# Patient Record
Sex: Female | Born: 1945 | Race: Black or African American | Hispanic: No | Marital: Married | Smoking: Never smoker
Health system: Southern US, Community
[De-identification: ages and names within clinical notes are randomized; demographics above are authoritative.]

## PROBLEM LIST (undated history)

## (undated) DIAGNOSIS — I10 Essential (primary) hypertension: Secondary | ICD-10-CM

## (undated) DIAGNOSIS — R0602 Shortness of breath: Secondary | ICD-10-CM

## (undated) DIAGNOSIS — C801 Malignant (primary) neoplasm, unspecified: Secondary | ICD-10-CM

---

## 1978-10-19 HISTORY — PX: ABDOMINAL HYSTERECTOMY: SHX81

## 1998-02-27 ENCOUNTER — Ambulatory Visit (HOSPITAL_COMMUNITY): Admission: RE | Admit: 1998-02-27 | Discharge: 1998-02-27 | Payer: Self-pay | Admitting: Family Medicine

## 1998-07-22 ENCOUNTER — Ambulatory Visit: Admission: RE | Admit: 1998-07-22 | Discharge: 1998-07-22 | Payer: Self-pay | Admitting: Family Medicine

## 1998-08-28 ENCOUNTER — Other Ambulatory Visit: Admission: RE | Admit: 1998-08-28 | Discharge: 1998-08-28 | Payer: Self-pay | Admitting: *Deleted

## 1999-01-10 ENCOUNTER — Ambulatory Visit: Admission: RE | Admit: 1999-01-10 | Discharge: 1999-01-10 | Payer: Self-pay | Admitting: Family Medicine

## 1999-07-29 ENCOUNTER — Ambulatory Visit (HOSPITAL_COMMUNITY): Admission: RE | Admit: 1999-07-29 | Discharge: 1999-07-29 | Payer: Self-pay | Admitting: Internal Medicine

## 1999-07-29 ENCOUNTER — Encounter: Payer: Self-pay | Admitting: Internal Medicine

## 1999-09-26 ENCOUNTER — Encounter: Payer: Self-pay | Admitting: *Deleted

## 1999-09-26 ENCOUNTER — Encounter: Admission: RE | Admit: 1999-09-26 | Discharge: 1999-09-26 | Payer: Self-pay | Admitting: Gynecology

## 1999-10-02 ENCOUNTER — Other Ambulatory Visit: Admission: RE | Admit: 1999-10-02 | Discharge: 1999-10-02 | Payer: Self-pay | Admitting: *Deleted

## 2000-12-13 ENCOUNTER — Other Ambulatory Visit: Admission: RE | Admit: 2000-12-13 | Discharge: 2000-12-13 | Payer: Self-pay | Admitting: *Deleted

## 2000-12-15 ENCOUNTER — Encounter: Payer: Self-pay | Admitting: *Deleted

## 2000-12-15 ENCOUNTER — Encounter: Admission: RE | Admit: 2000-12-15 | Discharge: 2000-12-15 | Payer: Self-pay | Admitting: *Deleted

## 2002-03-02 ENCOUNTER — Encounter: Admission: RE | Admit: 2002-03-02 | Discharge: 2002-03-02 | Payer: Self-pay | Admitting: *Deleted

## 2002-03-02 ENCOUNTER — Encounter: Payer: Self-pay | Admitting: *Deleted

## 2002-05-16 ENCOUNTER — Encounter: Admission: RE | Admit: 2002-05-16 | Discharge: 2002-05-29 | Payer: Self-pay | Admitting: Family Medicine

## 2003-06-28 ENCOUNTER — Encounter: Admission: RE | Admit: 2003-06-28 | Discharge: 2003-06-28 | Payer: Self-pay | Admitting: *Deleted

## 2003-06-28 ENCOUNTER — Encounter: Payer: Self-pay | Admitting: *Deleted

## 2004-02-12 ENCOUNTER — Emergency Department (HOSPITAL_COMMUNITY): Admission: EM | Admit: 2004-02-12 | Discharge: 2004-02-12 | Payer: Self-pay | Admitting: Emergency Medicine

## 2004-02-18 ENCOUNTER — Encounter: Admission: RE | Admit: 2004-02-18 | Discharge: 2004-02-18 | Payer: Self-pay | Admitting: Family Medicine

## 2004-02-27 ENCOUNTER — Encounter: Admission: RE | Admit: 2004-02-27 | Discharge: 2004-02-27 | Payer: Self-pay | Admitting: Family Medicine

## 2004-10-09 ENCOUNTER — Encounter: Admission: RE | Admit: 2004-10-09 | Discharge: 2004-10-09 | Payer: Self-pay | Admitting: Family Medicine

## 2004-10-16 ENCOUNTER — Ambulatory Visit: Payer: Self-pay | Admitting: Family Medicine

## 2004-11-11 ENCOUNTER — Ambulatory Visit: Payer: Self-pay | Admitting: Family Medicine

## 2005-10-08 ENCOUNTER — Ambulatory Visit: Payer: Self-pay | Admitting: Family Medicine

## 2005-10-16 ENCOUNTER — Encounter: Admission: RE | Admit: 2005-10-16 | Discharge: 2005-10-16 | Payer: Self-pay | Admitting: Family Medicine

## 2005-11-10 ENCOUNTER — Ambulatory Visit: Payer: Self-pay | Admitting: Family Medicine

## 2005-11-17 ENCOUNTER — Ambulatory Visit: Payer: Self-pay | Admitting: Family Medicine

## 2005-11-23 ENCOUNTER — Encounter: Admission: RE | Admit: 2005-11-23 | Discharge: 2005-11-23 | Payer: Self-pay | Admitting: Family Medicine

## 2006-04-29 IMAGING — CR DG CHEST 2V
2 series · 2 of 2 positions shown · non-contrast
Comparison: 02/18/04 and also prior chest CT scan dated 02/27/04.

CLINICAL DATA: Cough x two weeks.  Question pneumonia. 
 TWO VIEW CHEST:

[view not recorded (1 of 2)]
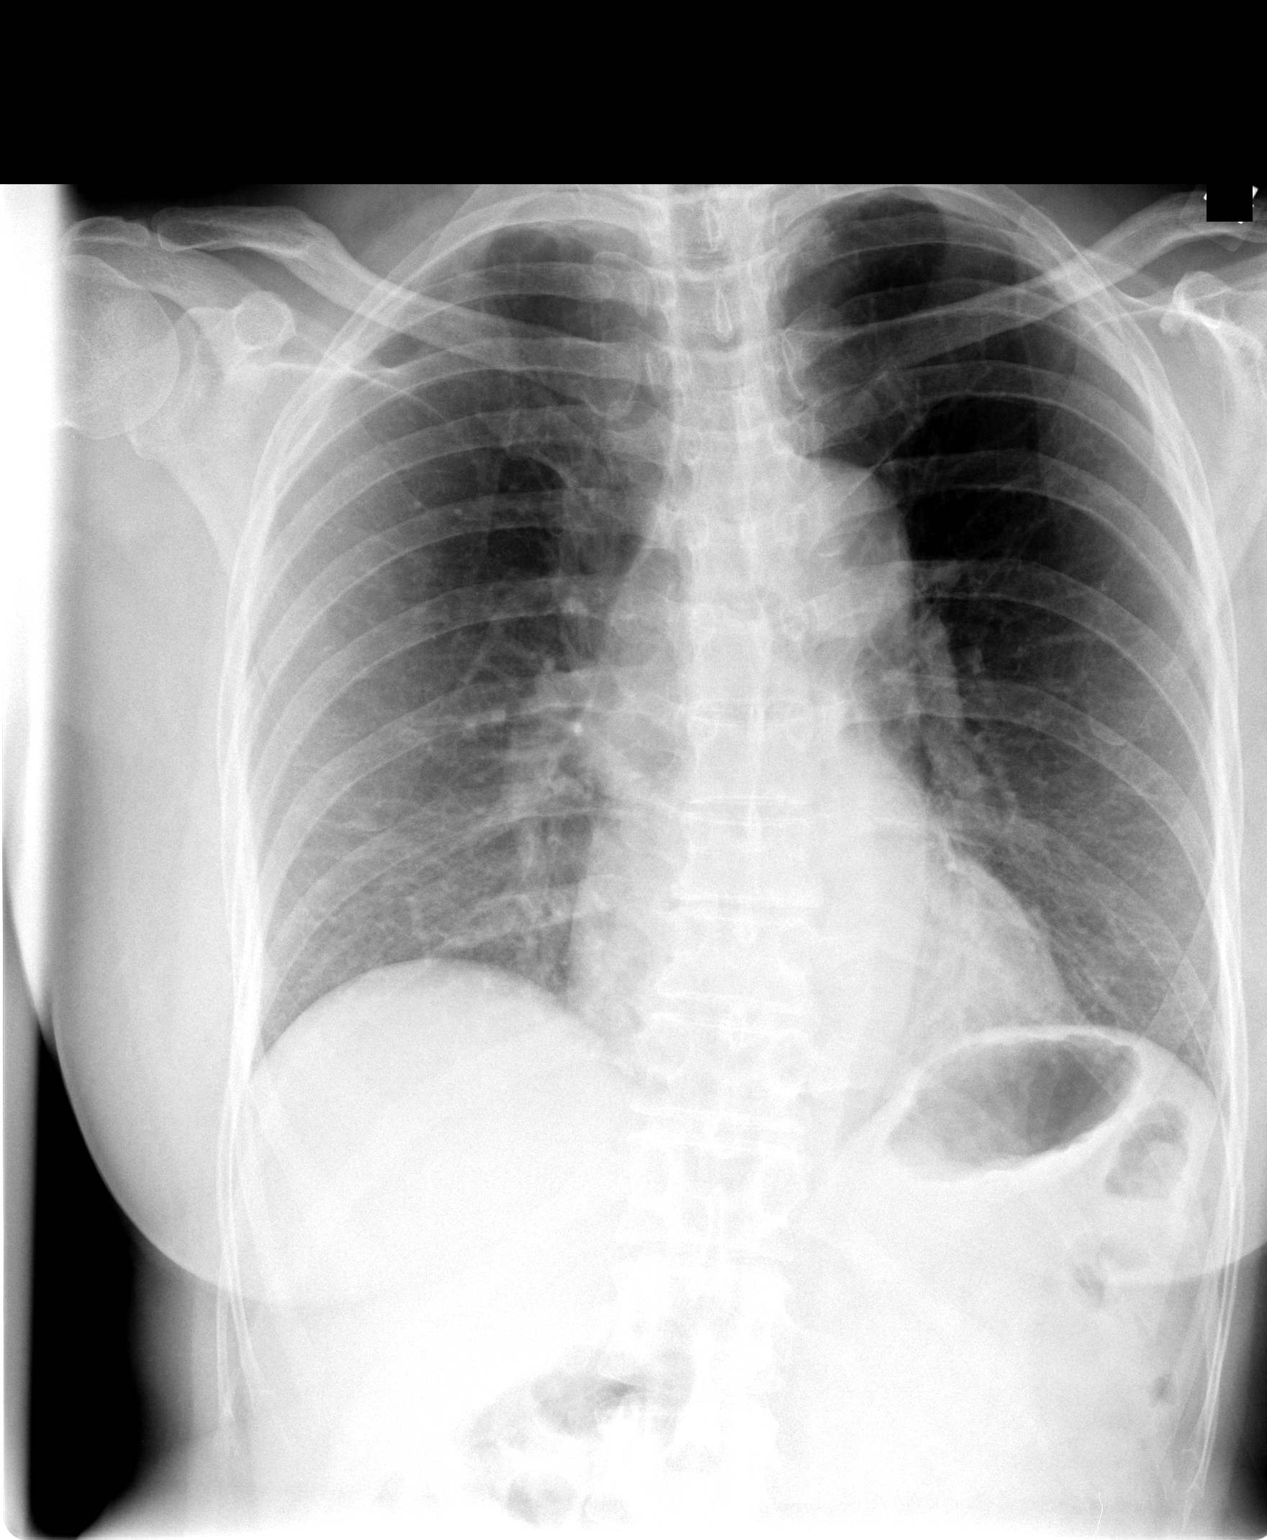

[view not recorded (2 of 2)]
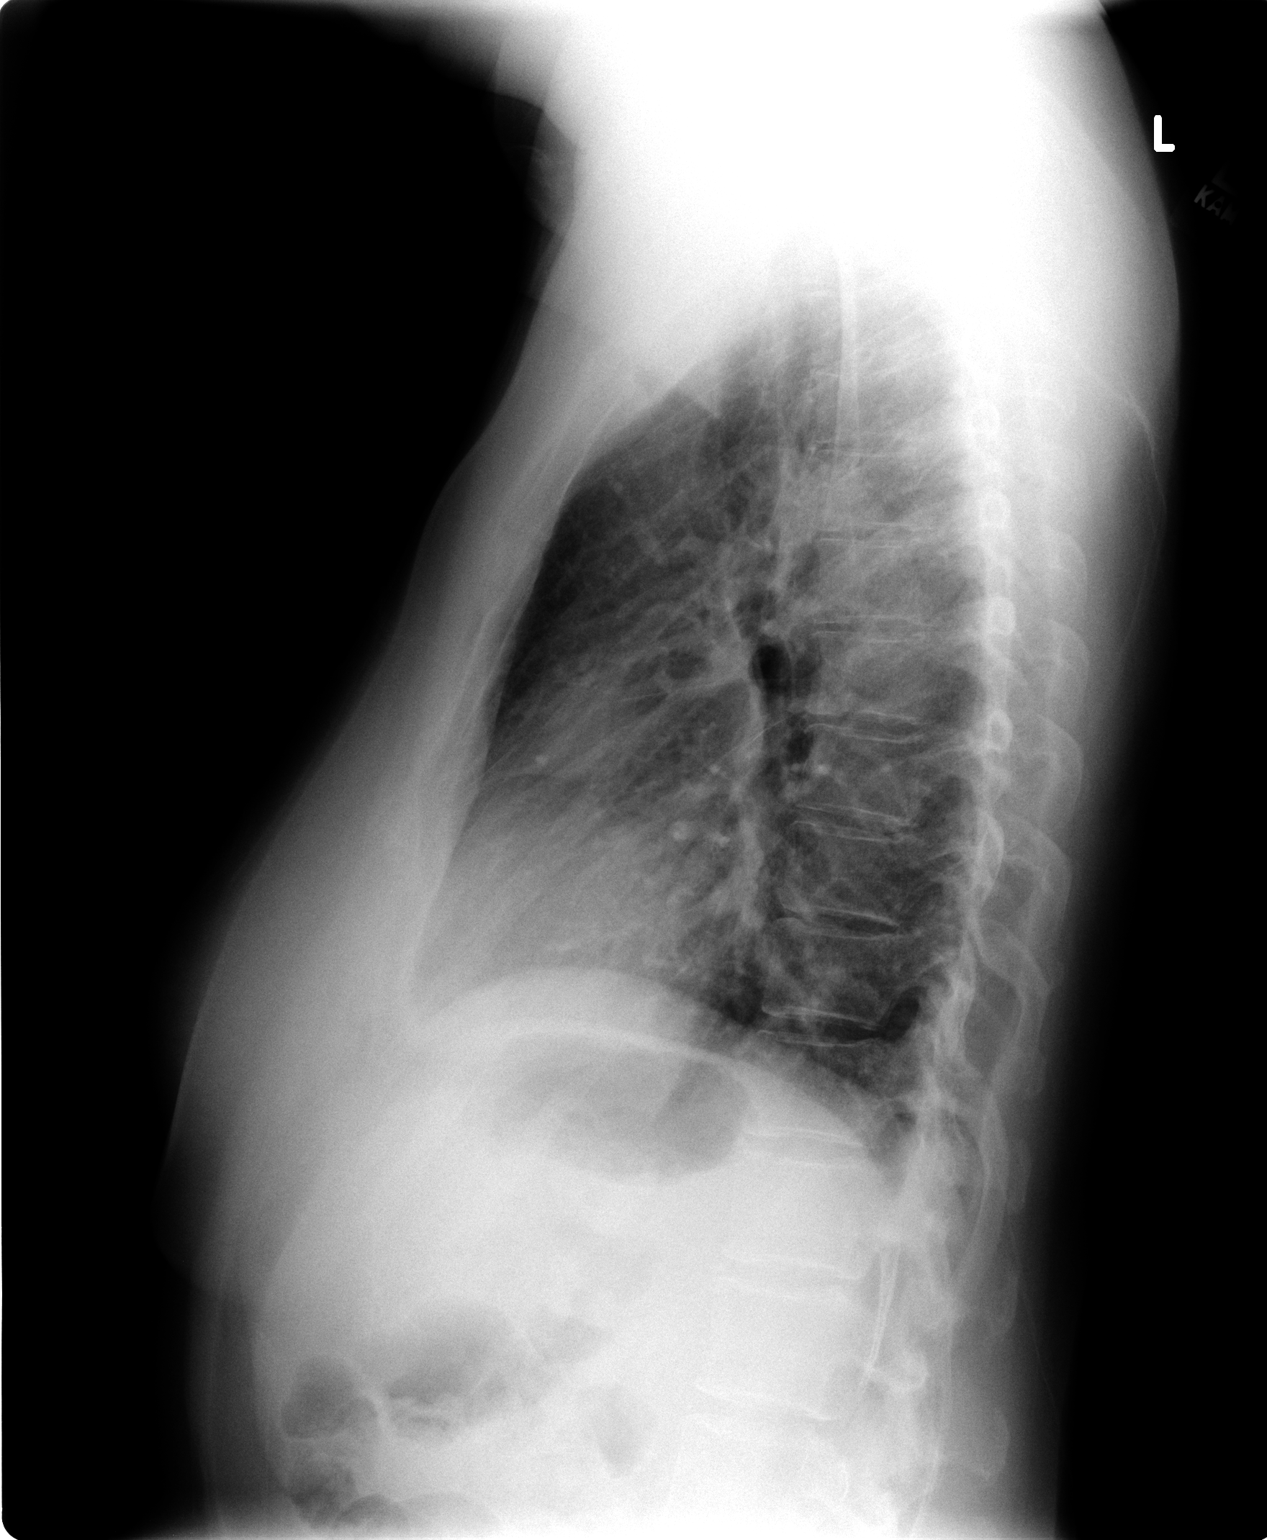

[2 of 2 positions shown; findings below may reference images not displayed]

The right hilum is mildly prominent but is unchanged and is consistent with prominent right pulmonary artery.  There are no infiltrates.  There are mildly accentuated bronchial markings.  The heart is normal in size.
IMPRESSION: Mildly prominent right pulmonary artery ? stable.  No acute infiltrates.

## 2006-11-22 ENCOUNTER — Ambulatory Visit: Payer: Self-pay | Admitting: Family Medicine

## 2006-11-22 LAB — CONVERTED CEMR LAB
ALT: 14 units/L (ref 0–40)
AST: 23 units/L (ref 0–37)
Albumin: 3.8 g/dL (ref 3.5–5.2)
BUN: 13 mg/dL (ref 6–23)
Basophils Relative: 0.5 % (ref 0.0–1.0)
Bilirubin, Direct: 0.1 mg/dL (ref 0.0–0.3)
CO2: 34 meq/L — ABNORMAL HIGH (ref 19–32)
Calcium: 9.5 mg/dL (ref 8.4–10.5)
Cholesterol: 218 mg/dL (ref 0–200)
Creatinine, Ser: 1.1 mg/dL (ref 0.4–1.2)
Direct LDL: 137.2 mg/dL
GFR calc non Af Amer: 54 mL/min
HDL: 56.2 mg/dL (ref >39.0)
Hemoglobin: 12.8 g/dL (ref 12.0–15.0)
Monocytes Absolute: 0.3 10*3/uL (ref 0.2–0.7)
Monocytes Relative: 6.5 % (ref 3.0–11.0)
Platelets: 254 10*3/uL (ref 150–400)
RDW: 14.7 % — ABNORMAL HIGH (ref 11.5–14.6)
Total Bilirubin: 0.6 mg/dL (ref 0.3–1.2)
Total CHOL/HDL Ratio: 3.9
VLDL: 19 mg/dL (ref 0–40)

## 2006-11-29 ENCOUNTER — Ambulatory Visit: Payer: Self-pay | Admitting: Family Medicine

## 2006-12-09 ENCOUNTER — Encounter: Admission: RE | Admit: 2006-12-09 | Discharge: 2006-12-09 | Payer: Self-pay | Admitting: Family Medicine

## 2007-05-15 DIAGNOSIS — M549 Dorsalgia, unspecified: Secondary | ICD-10-CM | POA: Insufficient documentation

## 2007-05-18 ENCOUNTER — Ambulatory Visit: Payer: Self-pay | Admitting: Family Medicine

## 2007-05-18 DIAGNOSIS — I10 Essential (primary) hypertension: Secondary | ICD-10-CM | POA: Insufficient documentation

## 2007-05-18 DIAGNOSIS — G43009 Migraine without aura, not intractable, without status migrainosus: Secondary | ICD-10-CM | POA: Insufficient documentation

## 2007-05-20 ENCOUNTER — Ambulatory Visit: Payer: Self-pay | Admitting: Family Medicine

## 2007-05-23 ENCOUNTER — Ambulatory Visit: Payer: Self-pay | Admitting: Family Medicine

## 2007-10-29 ENCOUNTER — Ambulatory Visit: Payer: Self-pay | Admitting: Family Medicine

## 2007-10-29 DIAGNOSIS — J019 Acute sinusitis, unspecified: Secondary | ICD-10-CM

## 2007-12-12 ENCOUNTER — Ambulatory Visit: Payer: Self-pay | Admitting: Family Medicine

## 2007-12-12 LAB — CONVERTED CEMR LAB
Alkaline Phosphatase: 52 units/L (ref 39–117)
BUN: 17 mg/dL (ref 6–23)
Basophils Relative: 0.8 % (ref 0.0–1.0)
CO2: 33 meq/L — ABNORMAL HIGH (ref 19–32)
Calcium: 9.6 mg/dL (ref 8.4–10.5)
Chloride: 103 meq/L (ref 96–112)
Eosinophils Relative: 0.9 % (ref 0.0–5.0)
GFR calc Af Amer: 73 mL/min
GFR calc non Af Amer: 60 mL/min
HCT: 36.3 % (ref 36.0–46.0)
Hemoglobin: 11.6 g/dL — ABNORMAL LOW (ref 12.0–15.0)
Lymphocytes Relative: 33.4 % (ref 12.0–46.0)
MCV: 86.5 fL (ref 78.0–100.0)
Neutrophils Relative %: 59.2 % (ref 43.0–77.0)
Nitrite: NEGATIVE
Platelets: 249 10*3/uL (ref 150–400)
Potassium: 4.2 meq/L (ref 3.5–5.1)
Protein, U semiquant: NEGATIVE
TSH: 0.89 microintl units/mL (ref 0.35–5.50)
Total Protein: 7.4 g/dL (ref 6.0–8.3)
Urobilinogen, UA: 0.2
VLDL: 12 mg/dL (ref 0–40)
WBC Urine, dipstick: NEGATIVE
WBC: 5.9 10*3/uL (ref 4.5–10.5)

## 2007-12-14 ENCOUNTER — Encounter: Admission: RE | Admit: 2007-12-14 | Discharge: 2007-12-14 | Payer: Self-pay | Admitting: Family Medicine

## 2007-12-26 ENCOUNTER — Ambulatory Visit: Payer: Self-pay | Admitting: Family Medicine

## 2007-12-26 DIAGNOSIS — L301 Dyshidrosis [pompholyx]: Secondary | ICD-10-CM | POA: Insufficient documentation

## 2007-12-26 DIAGNOSIS — N952 Postmenopausal atrophic vaginitis: Secondary | ICD-10-CM

## 2007-12-26 DIAGNOSIS — M159 Polyosteoarthritis, unspecified: Secondary | ICD-10-CM | POA: Insufficient documentation

## 2008-01-02 ENCOUNTER — Encounter: Payer: Self-pay | Admitting: Family Medicine

## 2008-01-02 ENCOUNTER — Ambulatory Visit: Payer: Self-pay | Admitting: Family Medicine

## 2008-01-17 ENCOUNTER — Telehealth (INDEPENDENT_AMBULATORY_CARE_PROVIDER_SITE_OTHER): Payer: Self-pay | Admitting: *Deleted

## 2008-02-21 ENCOUNTER — Telehealth: Payer: Self-pay | Admitting: Family Medicine

## 2008-11-05 DIAGNOSIS — K602 Anal fissure, unspecified: Secondary | ICD-10-CM | POA: Insufficient documentation

## 2008-11-09 ENCOUNTER — Ambulatory Visit: Payer: Self-pay | Admitting: Family Medicine

## 2009-01-04 ENCOUNTER — Ambulatory Visit: Payer: Self-pay | Admitting: Family Medicine

## 2009-01-04 LAB — CONVERTED CEMR LAB
Alkaline Phosphatase: 52 units/L (ref 39–117)
BUN: 22 mg/dL (ref 6–23)
Bilirubin, Direct: 0 mg/dL (ref 0.0–0.3)
Blood in Urine, dipstick: NEGATIVE
CO2: 32 meq/L (ref 19–32)
Chloride: 106 meq/L (ref 96–112)
Creatinine, Ser: 0.8 mg/dL (ref 0.4–1.2)
Direct LDL: 131.9 mg/dL
Eosinophils Absolute: 0.1 10*3/uL (ref 0.0–0.7)
HDL: 58.7 mg/dL (ref >39.00)
MCHC: 33.5 g/dL (ref 30.0–36.0)
MCV: 87.4 fL (ref 78.0–100.0)
Monocytes Absolute: 0.3 10*3/uL (ref 0.1–1.0)
Neutrophils Relative %: 56.1 % (ref 43.0–77.0)
Platelets: 198 10*3/uL (ref 150.0–400.0)
Total Bilirubin: 0.6 mg/dL (ref 0.3–1.2)
VLDL: 14 mg/dL (ref 0.0–40.0)
WBC: 4.9 10*3/uL (ref 4.5–10.5)
pH: 6

## 2009-01-11 ENCOUNTER — Ambulatory Visit: Payer: Self-pay | Admitting: Family Medicine

## 2009-01-11 DIAGNOSIS — L259 Unspecified contact dermatitis, unspecified cause: Secondary | ICD-10-CM

## 2009-01-14 ENCOUNTER — Encounter: Admission: RE | Admit: 2009-01-14 | Discharge: 2009-01-14 | Payer: Self-pay | Admitting: Family Medicine

## 2009-05-20 ENCOUNTER — Ambulatory Visit: Payer: Self-pay | Admitting: Internal Medicine

## 2009-05-20 DIAGNOSIS — K5900 Constipation, unspecified: Secondary | ICD-10-CM | POA: Insufficient documentation

## 2009-05-20 DIAGNOSIS — K219 Gastro-esophageal reflux disease without esophagitis: Secondary | ICD-10-CM

## 2009-05-20 DIAGNOSIS — R1031 Right lower quadrant pain: Secondary | ICD-10-CM

## 2009-06-11 ENCOUNTER — Ambulatory Visit: Payer: Self-pay | Admitting: Internal Medicine

## 2009-09-14 ENCOUNTER — Emergency Department (HOSPITAL_COMMUNITY): Admission: EM | Admit: 2009-09-14 | Discharge: 2009-09-14 | Payer: Self-pay | Admitting: Emergency Medicine

## 2009-11-19 ENCOUNTER — Ambulatory Visit: Payer: Self-pay | Admitting: Family Medicine

## 2009-11-22 DIAGNOSIS — J309 Allergic rhinitis, unspecified: Secondary | ICD-10-CM | POA: Insufficient documentation

## 2009-12-03 ENCOUNTER — Telehealth: Payer: Self-pay | Admitting: Family Medicine

## 2009-12-07 ENCOUNTER — Ambulatory Visit: Payer: Self-pay | Admitting: Family Medicine

## 2009-12-07 DIAGNOSIS — J069 Acute upper respiratory infection, unspecified: Secondary | ICD-10-CM | POA: Insufficient documentation

## 2009-12-09 ENCOUNTER — Telehealth: Payer: Self-pay | Admitting: Family Medicine

## 2010-01-09 ENCOUNTER — Ambulatory Visit: Payer: Self-pay | Admitting: Family Medicine

## 2010-01-09 LAB — CONVERTED CEMR LAB
AST: 21 units/L (ref 0–37)
Alkaline Phosphatase: 52 units/L (ref 39–117)
BUN: 16 mg/dL (ref 6–23)
Basophils Relative: 0.8 % (ref 0.0–3.0)
Bilirubin, Direct: 0.1 mg/dL (ref 0.0–0.3)
Blood in Urine, dipstick: NEGATIVE
CO2: 33 meq/L — ABNORMAL HIGH (ref 19–32)
Calcium: 9.3 mg/dL (ref 8.4–10.5)
Cholesterol: 205 mg/dL — ABNORMAL HIGH (ref 0–200)
Creatinine, Ser: 1.1 mg/dL (ref 0.4–1.2)
Glucose, Bld: 88 mg/dL (ref 70–99)
Glucose, Urine, Semiquant: NEGATIVE
HCT: 35.4 % — ABNORMAL LOW (ref 36.0–46.0)
HDL: 57.1 mg/dL (ref >39.00)
Hemoglobin: 11.3 g/dL — ABNORMAL LOW (ref 12.0–15.0)
Lymphocytes Relative: 34.5 % (ref 12.0–46.0)
MCHC: 32 g/dL (ref 30.0–36.0)
MCV: 88 fL (ref 78.0–100.0)
RBC: 4.02 M/uL (ref 3.87–5.11)
RDW: 15.6 % — ABNORMAL HIGH (ref 11.5–14.6)
Specific Gravity, Urine: 1.025
TSH: 1.11 microintl units/mL (ref 0.35–5.50)
Total Bilirubin: 0.4 mg/dL (ref 0.3–1.2)
Triglycerides: 84 mg/dL (ref 0.0–149.0)
pH: 5

## 2010-01-16 ENCOUNTER — Ambulatory Visit: Payer: Self-pay | Admitting: Family Medicine

## 2010-03-20 ENCOUNTER — Encounter: Admission: RE | Admit: 2010-03-20 | Discharge: 2010-03-20 | Payer: Self-pay | Admitting: Family Medicine

## 2010-11-09 ENCOUNTER — Encounter: Payer: Self-pay | Admitting: Family Medicine

## 2010-11-18 NOTE — Assessment & Plan Note (Signed)
Summary: cpx/njr   Vital Signs:  Patient profile:   65 year old female Menstrual status:  hysterectomy Height:      68 inches Weight:      196 pounds Temp:     98.4 degrees F oral BP sitting:   140 / 84  (left arm) Cuff size:   regular  Vitals Entered By: Kern Reap CMA Duncan Dull) (January 16, 2010 10:32 AM) CC: cpx Is Patient Diabetic? No Pain Assessment Patient in pain? no        Primary Care Provider:  Kelle Darting, MD  CC:  cpx.  History of Present Illness: Pamela Blake a 65 year old, married female, nonsmoker, who comes in today for evaluation of hypertension, eczema.  Her hypertension is treated with hydrochlorothiazide 25 mg daily.  BP 140/84.  Her eczema was treated with Lidex cream .05% b.i.d. p.r.n.  She takes calcium and vitamin D, and an aspirin tablet daily.  She gets routine eye care.  Dental care.  Colonoscopy every 10 years in GI.  She does BSE monthly and gets annual mammography.  Tetanus 2002.  Declines flu shot  Allergies: No Known Drug Allergies  Past History:  Past medical, surgical, family and social histories (including risk factors) reviewed, and no changes noted (except as noted below).  Past Medical History: Reviewed history from 12/26/2007 and no changes required. 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  Dr. Ezzard Standing   Past Surgical History: Reviewed history from 05/20/2009 and no changes required. cbx2 TAH/BSO Hysterectomy B-TUMOR L NOSRIL hernia repair  Family History: Reviewed history from 05/20/2009 and no changes required. father died in his 54s from a pulmonary embolus mother late 62s.  Osteoarthritis breast cancer, and circulation problems one brother in good health 4 sisters in good health No FH of Colon Cancer:  Social History: Reviewed history from 05/20/2009 and no changes required. 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 No apparent alopecia or balding. Eyes:  No corneal or conjunctival inflammation noted. EOMI. Perrla. Funduscopic exam benign, without hemorrhages, exudates or papilledema. Vision grossly normal. Ears:  External ear exam shows no significant lesions or deformities.  Otoscopic examination reveals clear canals, tympanic membranes are intact bilaterally without bulging, retraction, inflammation or discharge. Hearing is grossly normal bilaterally. Nose:  External nasal examination shows no deformity or inflammation. Nasal mucosa are pink and moist without lesions or exudates. Mouth:  Oral mucosa and oropharynx without lesions or exudates.  Teeth in good repair. Neck:  No deformities, masses, or tenderness noted. Chest Wall:  No deformities, masses, or tenderness noted. Breasts:  No mass, nodules, thickening, tenderness, bulging, retraction, inflamation, nipple discharge or skin changes noted.   Lungs:  Normal respiratory effort, chest expands symmetrically. Lungs are clear to auscultation, no crackles or wheezes. Heart:  Normal rate and regular rhythm. S1 and S2 normal without gallop, murmur, click, rub or other extra sounds. Abdomen:  Bowel sounds positive,abdomen soft and non-tender without masses, organomegaly or hernias noted. Rectal:  No external abnormalities noted. Normal sphincter tone. No rectal masses or tenderness. Genitalia:  Pelvic Exam:        External: normal female genitalia without lesions or masses  Vagina: normal without lesions or masses        Cervix: normal without lesions or masses        Adnexa: normal bimanual exam without masses or fullness        Uterus: normal by  palpation        Pap smear: not performed Msk:  No deformity or scoliosis noted of thoracic or lumbar spine.   Pulses:  R and L carotid,radial,femoral,dorsalis pedis and posterior tibial pulses are full and equal bilaterally Extremities:  No clubbing, cyanosis, edema, or deformity noted with normal full range of motion of all joints.   Neurologic:  No cranial nerve deficits noted. Station and gait are normal. Plantar reflexes are down-going bilaterally. DTRs are symmetrical throughout. Sensory, motor and coordinative functions appear intact.   Impression & Recommendations:  Problem # 1:  CONTACT DERMATITIS&OTHER ECZEMA DUE UNSPEC CAUSE (ICD-692.9) Assessment Improved  Her updated medication list for this problem includes:    Lidex 0.05 % Crea (Fluocinonide) .Marland Kitchen... Apply two times a day  Problem # 2:  PHYSICAL EXAMINATION (ICD-V70.0) Assessment: Unchanged  Orders: Prescription Created Electronically [phone])  Problem # 3:  HYPERTENSION (ICD-401.9) Assessment: Improved  Her updated medication list for this problem includes:    Hydrochlorothiazide 25 Mg Tabs (Hydrochlorothiazide) ..... Q am  Orders: Prescription Created Electronically [phone]) EKG w/ Interpretation (93000)  Complete Medication List: 1)  Hydrochlorothiazide 25 Mg Tabs (Hydrochlorothiazide) .... Q am 2)  Aspirin 325 Mg Tabs (Aspirin) .... One tablet by mouth once daily 3)  Ca+  .... Once daily 4)  Lidex 0.05 % Crea (Fluocinonide) .... Apply two times a day 5)  Vitamin D 1000 Unit Caps (Cholecalciferol) .... Once daily 6)  Calcium 600 1500 Mg Tabs (Calcium carbonate) .... Once daily 7)  Cheratussin Ac 100-10 Mg/65ml Syrp (Guaifenesin-codeine) .Marland Kitchen.. 1-2 tsp by mouth at bedtime as needed cough  Patient Instructions: 1)  Please schedule a follow-up appointment in 1 year. 2)  It is important that you exercise regularly at least 20 minutes 5 times a week. If you develop chest pain, have severe difficulty breathing, or feel  very tired , stop exercising immediately and seek medical attention. 3)  Schedule your mammogram. 4)  Schedule a colonoscopy/sigmoidoscopy to help detect colon cancer. 5)  Take calcium +Vitamin D daily. 6)  Take an Aspirin every day. Prescriptions: LIDEX 0.05 %  CREA (FLUOCINONIDE) apply two times a day  #60 grms x 6   Entered and Authorized by:   Roderick Pee MD   Signed by:   Roderick Pee MD on 01/16/2010   Method used:   Electronically to        [facility] [address] [phone]* (retail)       [address]       [place], [place]  [address]       Ph: [phone] or [phone]       Fax: [phone]   RxID:   [id number] HYDROCHLOROTHIAZIDE 25 MG TABS (HYDROCHLOROTHIAZIDE) q am  #100 Tablet x 4   Entered and Authorized by:   Roderick Pee MD   Signed by:   Roderick Pee MD on 01/16/2010   Method used:   [facility]        [facility] [address] [phone]* (retail)       [address]       [place], [place]  [address]       Ph: [phone] or [phone]       Fax: [phone]   RxID:   [id number]

## 2010-11-18 NOTE — Progress Notes (Signed)
Summary: Call-A-Nurse Report   Call-A-Nurse Triage Call Report Triage Record Num: [id number] Operator: Revonda Humphrey Patient Name: Pamela Blake Call Date & Time: 12/07/2009 10:03:20AM Patient Phone: PCP: Patient Gender: Female PCP Fax : Patient DOB: [birth year] [facility] Name: [facility] Reason for Call: Patient calling about cough, runny nose, headache x2 days started 12/05/09. c/o chest discomfort like pressure when coughing and that pain also in lower back when cough. Afebrile. Spoke with Vanese in office, sending to office now for eval. Protocol(s) Used: Chest Pain / Discomfort Recommended Outcome per Protocol: See Provider within 72 Hours Reason for Outcome: All other situations Care Advice:  ~ 12/07/2009 10:36:54AM Page 1 of 1 [id number]

## 2010-11-18 NOTE — Assessment & Plan Note (Signed)
Summary: ?sinus inf/allergy/cjr   Vital Signs:  Patient profile:   65 year old female Menstrual status:  hysterectomy Weight:      200 pounds Temp:     98.5 degrees F oral BP sitting:   124 / 80  (left arm) Cuff size:   regular  Vitals Entered By: Kern Reap CMA Duncan Dull) (November 19, 2009 10:55 AM)  Reason for Visit sinus pressure  Primary Care Provider:  Kelle Darting, MD   History of Present Illness: Pamela Blake a 65 year old female comes in today for a 3 week history of head congestion, runny nose, postnasal drip.  She said a history of allergic rhinitis.  No asthma, currently.  Does not recall any specific triggers.  Review of systems negative  Allergies: No Known Drug Allergies  Past History:  Past medical, surgical, family and social histories (including risk factors) reviewed for relevance to current acute and chronic problems.  Past Medical History: Reviewed history from 12/26/2007 and no changes required. 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  Dr. Ezzard Standing   Past Surgical History: Reviewed history from 05/20/2009 and no changes required. cbx2 TAH/BSO Hysterectomy B-TUMOR L NOSRIL hernia repair  Family History: Reviewed history from 05/20/2009 and no changes required. father died in his 32s from a pulmonary embolus mother late 35s.  Osteoarthritis breast cancer, and circulation problems one brother in good health 4 sisters in good health No FH of Colon Cancer:  Social History: Reviewed history from 05/20/2009 and no changes required. Occupation: Married Never Smoked Alcohol use-no Drug use-no Regular exercise-yes Daily Caffeine Use: 5 daily   Review of Systems      See HPI  Physical Exam  General:  Well-developed,well-nourished,in no acute distress; alert,appropriate and cooperative throughout examination Head:  Normocephalic and atraumatic without obvious  abnormalities. No apparent alopecia or balding. Eyes:  No corneal or conjunctival inflammation noted. EOMI. Perrla. Funduscopic exam benign, without hemorrhages, exudates or papilledema. Vision grossly normal. Ears:  External ear exam shows no significant lesions or deformities.  Otoscopic examination reveals clear canals, tympanic membranes are intact bilaterally without bulging, retraction, inflammation or discharge. Hearing is grossly normal bilaterally. Nose:  External nasal examination shows no deformity or inflammation. Nasal mucosa are pink and moist without lesions or exudates. Mouth:  Oral mucosa and oropharynx without lesions or exudates.  Teeth in good repair. Neck:  No deformities, masses, or tenderness noted. Chest Wall:  No deformities, masses, or tenderness noted. Lungs:  Normal respiratory effort, chest expands symmetrically. Lungs are clear to auscultation, no crackles or wheezes.   Impression & Recommendations:  Problem # 1:  ALLERGIC RHINITIS (ICD-477.9) Assessment New  Complete Medication List: 1)  Hydrochlorothiazide 25 Mg Tabs (Hydrochlorothiazide) .... Q am 2)  Aspirin 325 Mg Tabs (Aspirin) .... One tablet by mouth once daily 3)  Ca+  .... Once daily 4)  Lidex 0.05 % Crea (Fluocinonide) .... Apply two times a day 5)  Vitamin D 1000 Unit Caps (Cholecalciferol) .... Once daily 6)  Calcium 600 1500 Mg Tabs (Calcium carbonate) .... Once daily  Other Orders: Prescription Created Electronically [phone])  Patient Instructions: 1)  begin plain Claritin, or plain Zyrtec.  Return p.r.n.

## 2010-11-18 NOTE — Progress Notes (Signed)
Summary: REQ FOR REFERRAL?  Phone Note Call from Patient   Caller: Patient [phone] Reason for Call: Talk to Nurse, Talk to Doctor Summary of Call: Pt called to req that Dr Tawanna Cooler give her a referral to an Allergy Specialist.... Pt adv she is still exhibiting sxs of congestion, ST, aches/pains...Marland KitchenMarland Kitchen Pt adv that due to her insurance she will need to have a referral in order for her insurance to pay...Marland KitchenMarland Kitchen Pt can be reached at [phone].  Pt would like her appt with an allergist to be mid-morning if possible...Marland Kitchen??  Initial call taken by: Debbra Riding,  December 03, 2009 9:20 AM  Follow-up for Phone Call        patient can call Dr. Thedore Mins, and make her own appointment.  Does not need a referral from me Follow-up by: Roderick Pee MD,  December 03, 2009 10:09 AM  Additional Follow-up for Phone Call Additional follow up Details #1::        Phone Call Completed---- Pt adv of Dr. Nelida Meuse instructions.... pt was given # to Dr Thedore Mins as well as [facility]  per pt request. Additional Follow-up by: Debbra Riding,  December 03, 2009 10:39 AM

## 2010-11-18 NOTE — Assessment & Plan Note (Signed)
Summary: HEAD COLD AN SLIGHT CHEST DISCOMFORT//VGJ   Vital Signs:  Patient profile:   65 year old female Menstrual status:  hysterectomy Weight:      192 pounds O2 Sat:      97 % on Room air Temp:     98.6 degrees F oral Pulse rate:   93 / minute BP sitting:   110 / 70  (left arm)  Vitals Entered By: Doristine Devoid (December 07, 2009 12:09 PM)  O2 Flow:  Room air CC: sinus congestion and cough lots of pressure and some HA   Acute Visit History:      The patient complains of cough, earache, fever, headache, nasal discharge, and sore throat.  These symptoms began 4 days ago.  She denies sinus problems.  Other comments include: chills body ache, back ache gradually improving. occ dizzy tolerating by mouth   OTC zyrtec, tyleno, mucinex.        Her highest temperature has been 101.        The cough interferes with her sleep.  The character of the cough is described as nonproductive.  There is no history of wheezing or shortness of breath associated with her cough.        The earache is located on both sides.        Problems Prior to Update: 1)  Allergic Rhinitis  (ICD-477.9) 2)  Gerd  (ICD-530.81) 3)  Screening Colorectal-cancer  (ICD-V76.51) 4)  Constipation  (ICD-564.00) 5)  Abdominal Pain-rlq  (ICD-789.03) 6)  Contact Dermatitis&other Eczema Due Unspec Cause  (ICD-692.9) 7)  Rectal Fissure  (ICD-565.0) 8)  Dyshidrosis  (ICD-705.81) 9)  Vaginitis, Atrophic  (ICD-627.3) 10)  Generalized Osteoarthrosis Unspecified Site  (ICD-715.00) 11)  Physical Examination  (ICD-V70.0) 12)  Sinusitis- Acute-nos  (ICD-461.9) 13)  Back Pain, Right  (ICD-724.5) 14)  Common Migraine  (ICD-346.10) 15)  Hypertension  (ICD-401.9)  Current Medications (verified): 1)  Hydrochlorothiazide 25 Mg Tabs (Hydrochlorothiazide) .... Q Am 2)  Aspirin 325 Mg  Tabs (Aspirin) .... One Tablet By Mouth Once Daily 3)  Ca+ .... Once Daily 4)  Lidex 0.05 %  Crea (Fluocinonide) .... Apply Two Times A Day 5)   Vitamin D 1000 Unit Caps (Cholecalciferol) .... Once Daily 6)  Calcium 600 1500 Mg Tabs (Calcium Carbonate) .... Once Daily 7)  Cheratussin Ac 100-10 Mg/10ml Syrp (Guaifenesin-Codeine) .Marland Kitchen.. 1-2 Tsp By Mouth At Bedtime As Needed Cough  Allergies (verified): No Known Drug Allergies  Past History:  Past medical, surgical, family and social histories (including risk factors) reviewed, and no changes noted (except as noted below).  Past Medical History: Reviewed history from 12/26/2007 and no changes required. 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  Dr. Ezzard Standing   Past Surgical History: Reviewed history from 05/20/2009 and no changes required. cbx2 TAH/BSO Hysterectomy B-TUMOR L NOSRIL hernia repair  Family History: Reviewed history from 05/20/2009 and no changes required. father died in his 21s from a pulmonary embolus mother late 67s.  Osteoarthritis breast cancer, and circulation problems one brother in good health 4 sisters in good health No FH of Colon Cancer:  Social History: Reviewed history from 05/20/2009 and no changes required. Occupation: Married Never Smoked Alcohol use-no Drug use-no Regular exercise-yes Daily Caffeine Use: 5 daily   Review of Systems General:  Complains of chills and fatigue. CV:  chest pain with cough. Resp:  Denies shortness of breath. GI:  Denies abdominal pain.  Physical Exam  General:  overweight female in NAD Head:  no maxillary sinus ttp Ears:  clear fluid B TMs Nose:  nasal discharge, no mucosal pallor.   Mouth:  MMM Neck:  no carotid bruit or thyromegaly no cervical or supraclavicular lymphadenopathy  Lungs:  Normal respiratory effort, chest expands symmetrically. Lungs are clear to auscultation, no crackles or wheezes. Heart:  Normal rate and regular rhythm. S1 and S2 normal without gallop, murmur, click, rub or other extra sounds.   Impression &  Recommendations:  Problem # 1:  URI (ICD-465.9) VS flu. Out of timeline for tamiflu. Recommend symptomatica care. Call if not imrpoving as expected.  Her updated medication list for this problem includes:    Aspirin 325 Mg Tabs (Aspirin) ..... One tablet by mouth once daily    Cheratussin Ac 100-10 Mg/4ml Syrp (Guaifenesin-codeine) .Marland Kitchen... 1-2 tsp by mouth at bedtime as needed cough  Complete Medication List: 1)  Hydrochlorothiazide 25 Mg Tabs (Hydrochlorothiazide) .... Q am 2)  Aspirin 325 Mg Tabs (Aspirin) .... One tablet by mouth once daily 3)  Ca+  .... Once daily 4)  Lidex 0.05 % Crea (Fluocinonide) .... Apply two times a day 5)  Vitamin D 1000 Unit Caps (Cholecalciferol) .... Once daily 6)  Calcium 600 1500 Mg Tabs (Calcium carbonate) .... Once daily 7)  Cheratussin Ac 100-10 Mg/65ml Syrp (Guaifenesin-codeine) .Marland Kitchen.. 1-2 tsp by mouth at bedtime as needed cough  Patient Instructions: 1)  Rest, fluids push. 2)  Cough supressant, nasal saline and mucinex. 3)  Call if not improving as expected in 5-7 days.  Prescriptions: CHERATUSSIN AC 100-10 MG/5ML SYRP (GUAIFENESIN-CODEINE) 1-2 tsp by mouth at bedtime as needed cough  #8 oz x 0   Entered and Authorized by:   Kerby Nora MD   Signed by:   Kerby Nora MD on 12/07/2009   Method used:   Print then Give to Patient   RxID:   [id number]     VITAL SIGNS:  Patient Profile:   65 Years Old Female Height:     68 inches Weight:      192 pounds O2 Sat:      97 % BP sitting:   110 / 70 Temp:     98.6 degrees F. Pulse rate:   93

## 2011-01-02 ENCOUNTER — Telehealth: Payer: Self-pay | Admitting: Family Medicine

## 2011-01-02 NOTE — Telephone Encounter (Signed)
Pt called and needs to know the date of her last tetanus shot. Pt was bitten by a child yesterday.

## 2011-01-05 NOTE — Telephone Encounter (Signed)
patient  Notified that it is time for another Tdap

## 2011-01-27 ENCOUNTER — Other Ambulatory Visit: Payer: Self-pay | Admitting: Family Medicine

## 2011-03-03 NOTE — Assessment & Plan Note (Signed)
[facility]                                 ON-CALL NOTE   MARCUS, GROLL                     MRN:          [id number]  DATE:10/29/2007                            DOB:          07-Mar-1946    Patient Pamela Blake, DOB 04/01/1946; date of interaction 10/29/2007  at 8:21 a.m.   PHONE NUMBER:  [phone]   OBJECTIVE:  The patient has an upper respiratory infection with chest  pain, cough and sore throat.  She would like to be seen.  She was told  to come in at 10:45 A.M.   PRIMARY CARE PHYSICIAN:  Dr. Tawanna Cooler, office is [place].     Arta Silence, MD  Electronically Signed    RNS/MedQ  DD: 10/29/2007  DT: 10/29/2007  Job #: [id number]

## 2011-06-04 ENCOUNTER — Other Ambulatory Visit: Payer: Self-pay | Admitting: Family Medicine

## 2011-06-04 DIAGNOSIS — Z1231 Encounter for screening mammogram for malignant neoplasm of breast: Secondary | ICD-10-CM

## 2011-07-08 ENCOUNTER — Ambulatory Visit
Admission: RE | Admit: 2011-07-08 | Discharge: 2011-07-08 | Disposition: A | Discharge status: Home / Self Care | Payer: Federal, State, Local not specified - PPO | Source: Ambulatory Visit | Attending: Family Medicine | Admitting: Family Medicine

## 2011-07-08 DIAGNOSIS — Z1231 Encounter for screening mammogram for malignant neoplasm of breast: Secondary | ICD-10-CM

## 2012-05-24 ENCOUNTER — Other Ambulatory Visit: Payer: Self-pay | Admitting: Family Medicine

## 2012-05-24 DIAGNOSIS — R319 Hematuria, unspecified: Secondary | ICD-10-CM

## 2012-05-26 ENCOUNTER — Ambulatory Visit
Admission: RE | Admit: 2012-05-26 | Discharge: 2012-05-26 | Disposition: A | Discharge status: Home / Self Care | Payer: 59 | Source: Ambulatory Visit | Attending: Family Medicine | Admitting: Family Medicine

## 2012-05-26 DIAGNOSIS — R319 Hematuria, unspecified: Secondary | ICD-10-CM

## 2013-03-29 ENCOUNTER — Other Ambulatory Visit: Payer: Self-pay

## 2013-03-29 DIAGNOSIS — Z1231 Encounter for screening mammogram for malignant neoplasm of breast: Secondary | ICD-10-CM

## 2013-04-03 ENCOUNTER — Ambulatory Visit
Admission: RE | Admit: 2013-04-03 | Discharge: 2013-04-03 | Disposition: A | Discharge status: Home / Self Care | Payer: Medicare Other | Source: Ambulatory Visit

## 2013-04-03 DIAGNOSIS — Z1231 Encounter for screening mammogram for malignant neoplasm of breast: Secondary | ICD-10-CM

## 2013-11-10 ENCOUNTER — Encounter (INDEPENDENT_AMBULATORY_CARE_PROVIDER_SITE_OTHER): Payer: Self-pay

## 2013-11-10 ENCOUNTER — Encounter (INDEPENDENT_AMBULATORY_CARE_PROVIDER_SITE_OTHER): Payer: Self-pay | Admitting: Surgery

## 2013-11-10 ENCOUNTER — Ambulatory Visit (INDEPENDENT_AMBULATORY_CARE_PROVIDER_SITE_OTHER): Payer: Medicare Other | Admitting: Surgery

## 2013-11-10 VITALS — BP 126/80 | HR 76 | Temp 98.0°F | Resp 18 | Ht 69.0 in | Wt 196.0 lb

## 2013-11-10 DIAGNOSIS — K859 Acute pancreatitis without necrosis or infection, unspecified: Secondary | ICD-10-CM

## 2013-11-10 DIAGNOSIS — K8010 Calculus of gallbladder with chronic cholecystitis without obstruction: Secondary | ICD-10-CM | POA: Insufficient documentation

## 2013-11-10 NOTE — Patient Instructions (Signed)
  [facility]  LAPAROSCOPIC SURGERY - POST-OP INSTRUCTIONS  Always review your discharge instruction sheet given to you by the facility where your surgery was performed.  A prescription for pain medication may be given to you upon discharge.  Take your pain medication as prescribed.  If narcotic pain medicine is not needed, then you may take acetaminophen (Tylenol) or ibuprofen (Advil) as needed.  Take your usually prescribed medications unless otherwise directed.  If you need a refill on your pain medication, please contact your pharmacy.  They will contact our office to request authorization. Prescriptions will not be filled after 5 P.M. or on weekends.  You should follow a light diet the first few days after arrival home, such as soup and crackers or toast.  Be sure to include plenty of fluids daily.  Most patients will experience some swelling and bruising in the area of the incisions.  Ice packs will help.  Swelling and bruising can take several days to resolve.   It is common to experience some constipation if taking pain medication after surgery.  Increasing fluid intake and taking a stool softener (such as Colace) will usually help or prevent this problem from occurring.  A mild laxative (Milk of Magnesia or Miralax) should be taken according to package instructions if there are no bowel movements after 48 hours.  Unless discharge instructions indicate otherwise, you may remove your bandages 24-48 hours after surgery, and you may shower at that time.  You may have steri-strips (small skin tapes) in place directly over the incision.  These strips should be left on the skin for 7-10 days.  If your surgeon used skin glue on the incision, you may shower in 24 hours.  The glue will flake off over the next 2-3 weeks.  Any sutures or staples will be removed at the office during your follow-up visit.  ACTIVITIES:  You may resume regular (light) daily activities beginning the  next day-such as daily self-care, walking, climbing stairs-gradually increasing activities as tolerated.  You may have sexual intercourse when it is comfortable.  Refrain from any heavy lifting or straining until approved by your doctor.  You may drive when you are no longer taking prescription pain medication, you can comfortably wear a seatbelt, and you can safely maneuver your car and apply brakes.  You should see your doctor in the office for a follow-up appointment approximately 2-3 weeks after your surgery.  Make sure that you call for this appointment within a day or two after you arrive home to insure a convenient appointment time.  WHEN TO CALL YOUR DOCTOR: 1. Fever over 101.0 2. Inability to urinate 3. Continued bleeding from incision 4. Increased pain, redness, or drainage from the incision 5. Increasing abdominal pain  The clinic staff is available to answer your questions during regular business hours.  Please don't hesitate to call and ask to speak to one of the nurses for clinical concerns.  If you have a medical emergency, go to the nearest emergency room or call 911.  A surgeon from [facility] is always on call for the hospital.  Aerilynn Goin M. Macenzie Burford, MD, FACS [facility] Office: [phone] Toll Free:  [phone] FAX [phone]  Web site: [email or web address] 

## 2013-11-10 NOTE — Progress Notes (Signed)
General Surgery [facility]  Chief Complaint  Patient presents with  . New Evaluation    symptomatic gallstones, pancreatitis - referral from Dr. Dala Dock @ [employer]    HISTORY: Patient is a 68 year old female referred by her primary care physician for known cholelithiasis and recent episode of biliary colic complicated by pancreatitis. Patient experienced onset last week of sudden abdominal pain. This radiated to the back. It was associated with nausea and vomiting. Patient denies fever but did have chills. She denies jaundice or acholic stools. Laboratory studies showed a normal CBC, normal liver function tests, but an elevated lipase level of 111. Patient had had a previous ultrasound in 2013 which documented gallstones. Patient improved symptomatically. No further diagnostic studies were performed. Patient is referred to surgery for consideration for cholecystectomy.  Previous abdominal surgery includes a total abdominal hysterectomy and a previous umbilical hernia repair.  Family history is notable for cholecystectomy and the patient's daughter.   History reviewed. No pertinent past medical history.  Current Outpatient Prescriptions  Medication Sig Dispense Refill  . calcium-vitamin D (OSCAL) 250-125 MG-UNIT per tablet Take 1 tablet by mouth daily.      . hydrochlorothiazide 25 MG tablet TAKE 1 TABLET EVERY MORNING  100 tablet  0   No current facility-administered medications for this visit.    No Known Allergies  History reviewed. No pertinent family history.  History   Social History  . Marital Status: Married    Spouse Name: N/A    Number of Children: N/A  . Years of Education: N/A   Social History Main Topics  . Smoking status: Never Smoker   . Smokeless tobacco: None  . Alcohol Use: None  . Drug Use: None  . Sexual Activity: None   Other Topics Concern  . None   Social History Narrative  . None    REVIEW OF SYSTEMS -  PERTINENT POSITIVES ONLY: Denies hepatobiliary or pancreatic disease in the past. Denies jaundice. Denies acholic stools. Denies fever.  EXAM: Filed Vitals:   11/10/13 1118  BP: 126/80  Pulse: 76  Temp: 98 F (36.7 C)  Resp: 18    GENERAL: well-developed, well-nourished, no acute distress HEENT: normocephalic; pupils equal and reactive; sclerae clear; dentition good; mucous membranes moist NECK:  Palpable 1 cm nodule left thyroid lobe; symmetric on extension; no palpable anterior or posterior cervical lymphadenopathy; no supraclavicular masses; no tenderness CHEST: clear to auscultation bilaterally without rales, rhonchi, or wheezes CARDIAC: regular rate and rhythm without significant murmur; peripheral pulses are full ABDOMEN: soft without distension; bowel sounds present; no mass; no hepatosplenomegaly; no hernia; mild diffuse tenderness upper abdomen without mass or guarding EXT:  non-tender without edema; no deformity NEURO: no gross focal deficits; no sign of tremor   LABORATORY RESULTS: See [facility] (CHL-Epic) for most recent results  RADIOLOGY RESULTS: See [facility] (CHL-Epic) for most recent results  IMPRESSION: #1 symptomatic cholelithiasis #2 recent acute pancreatitis, likely of biliary origin  PLAN: I discussed the above findings at length with the patient and her husband. I would like to obtain an abdominal ultrasound to assess her gallbladder, bile ducts, and pancreas. Patient and I discussed cholecystectomy. We discussed risk and benefits of laparoscopic cholecystectomy and the possibility of conversion to open surgery. We discussed the hospital stay to be anticipated. She would like to proceed with cholecystectomy in the near future. We will make these arrangements.  I will contact the patient with the results of her abdominal  ultrasound is sent as they are available.  The risks and benefits of the procedure have been discussed at length with the  patient.  The patient understands the proposed procedure, potential alternative treatments, and the course of recovery to be expected.  All of the patient's questions have been answered at this time.  The patient wishes to proceed with surgery.  Sebert Stollings M. Katrinka Herbison, MD, FACS General & Endocrine Surgery [facility]  Primary Care Physician: NNODI, ADAKU, MD   

## 2013-11-15 ENCOUNTER — Ambulatory Visit
Admission: RE | Admit: 2013-11-15 | Discharge: 2013-11-15 | Disposition: A | Discharge status: Home / Self Care | Payer: Medicare Other | Source: Ambulatory Visit | Attending: Surgery | Admitting: Surgery

## 2013-11-15 DIAGNOSIS — K859 Acute pancreatitis without necrosis or infection, unspecified: Secondary | ICD-10-CM

## 2013-11-15 DIAGNOSIS — K8010 Calculus of gallbladder with chronic cholecystitis without obstruction: Secondary | ICD-10-CM

## 2013-11-15 NOTE — Progress Notes (Signed)
Quick Note:  Ultrasound shows gallstones and no other significant abnormality.  Plan to proceed with cholecystectomy as scheduled.  Earnstine Regal, MD, [facility] Office: [phone]   ______

## 2013-11-21 ENCOUNTER — Encounter (HOSPITAL_COMMUNITY): Payer: Self-pay | Admitting: Pharmacy Technician

## 2013-11-22 ENCOUNTER — Encounter (HOSPITAL_COMMUNITY): Payer: Self-pay

## 2013-11-22 ENCOUNTER — Encounter (HOSPITAL_COMMUNITY)
Admission: RE | Admit: 2013-11-22 | Discharge: 2013-11-22 | Disposition: A | Discharge status: Home / Self Care | Payer: Medicare Other | Source: Ambulatory Visit | Attending: Surgery | Admitting: Surgery

## 2013-11-22 DIAGNOSIS — Z01812 Encounter for preprocedural laboratory examination: Secondary | ICD-10-CM | POA: Insufficient documentation

## 2013-11-22 DIAGNOSIS — Z01810 Encounter for preprocedural cardiovascular examination: Secondary | ICD-10-CM | POA: Insufficient documentation

## 2013-11-22 HISTORY — DX: Essential (primary) hypertension: I10

## 2013-11-22 HISTORY — DX: Shortness of breath: R06.02

## 2013-11-22 LAB — COMPREHENSIVE METABOLIC PANEL
ALK PHOS: 68 U/L (ref 39–117)
ALT: 12 U/L (ref 0–35)
AST: 18 U/L (ref 0–37)
Albumin: 3.6 g/dL (ref 3.5–5.2)
BUN: 14 mg/dL (ref 6–23)
CO2: 32 meq/L (ref 19–32)
Calcium: 9.1 mg/dL (ref 8.4–10.5)
Chloride: 101 mEq/L (ref 96–112)
Creatinine, Ser: 0.95 mg/dL (ref 0.50–1.10)
GFR calc non Af Amer: 61 mL/min — ABNORMAL LOW (ref >90)
GFR, EST AFRICAN AMERICAN: 71 mL/min — AB (ref >90)
GLUCOSE: 90 mg/dL (ref 70–99)
Potassium: 3.3 mEq/L — ABNORMAL LOW (ref 3.7–5.3)
SODIUM: 142 meq/L (ref 137–147)
TOTAL PROTEIN: 7.7 g/dL (ref 6.0–8.3)
Total Bilirubin: 0.4 mg/dL (ref 0.3–1.2)

## 2013-11-22 LAB — CBC WITH DIFFERENTIAL/PLATELET
Basophils Absolute: 0 10*3/uL (ref 0.0–0.1)
Basophils Relative: 0 % (ref 0–1)
EOS ABS: 0.1 10*3/uL (ref 0.0–0.7)
Eosinophils Relative: 3 % (ref 0–5)
HCT: 34.1 % — ABNORMAL LOW (ref 36.0–46.0)
Hemoglobin: 11.1 g/dL — ABNORMAL LOW (ref 12.0–15.0)
LYMPHS ABS: 1.4 10*3/uL (ref 0.7–4.0)
LYMPHS PCT: 45 % (ref 12–46)
MCH: 27.4 pg (ref 26.0–34.0)
MCHC: 32.6 g/dL (ref 30.0–36.0)
MCV: 84.2 fL (ref 78.0–100.0)
Monocytes Absolute: 0.3 10*3/uL (ref 0.1–1.0)
Monocytes Relative: 8 % (ref 3–12)
Neutro Abs: 1.4 10*3/uL — ABNORMAL LOW (ref 1.7–7.7)
Neutrophils Relative %: 44 % (ref 43–77)
PLATELETS: 204 10*3/uL (ref 150–400)
RBC: 4.05 MIL/uL (ref 3.87–5.11)
RDW: 15.2 % (ref 11.5–15.5)
WBC: 3.2 10*3/uL — AB (ref 4.0–10.5)

## 2013-11-22 LAB — AMYLASE: Amylase: 72 U/L (ref 0–105)

## 2013-11-22 LAB — PROTIME-INR
INR: 0.96 (ref 0.00–1.49)
Prothrombin Time: 12.6 seconds (ref 11.6–15.2)

## 2013-11-22 LAB — LIPASE, BLOOD: Lipase: 26 U/L (ref 11–59)

## 2013-11-22 NOTE — Progress Notes (Signed)
Quick Note:  These results are acceptable for scheduled surgery.  Jozlyn Schatz M. Gaynor Ferreras, MD, FACS [facility] Office: [phone]   ______ 

## 2013-11-22 NOTE — Patient Instructions (Signed)
DEEMA JUNCAJ  11/22/2013   Your procedure is scheduled on: 11-28-13  Report to [place] at 730 AM.  Call this number if you have problems the morning of surgery: [phone]   Due to [place] flu policy no visitors under age 67 at this time   Remember:   Do not eat food or drink liquids after midnight.   Take these medicines the morning of surgery with A SIP OF WATER:   Do not wear jewelry, make-up or nail polish.  Do not wear lotions, powders, or perfumes. You may wear deodorant.  Do not shave 48  hours prior to surgery. Men may shave face and neck.  Do not bring valuables to the hospital.  [facility] is not responsible for any belongings or valuables.                 Contacts, dentures or bridgework may not be worn into surgery.  Leave suitcase in the car. After surgery it may be brought to your room.  For patients admitted to the hospital, checkout time is 11:00 AM the day of discharge  .   Patients discharged the day of surgery will not be allowed to drive  home.  Name and phone number of your driver:   Special Instructions:   Shower using CHG 2 nights before surgery and the night before surgery.  If you shower the day of surgery use CHG.  Use special wash - you have one bottle of CHG for all showers.  You should use approximately 1/3 of the bottle for each shower.   Please read over the following fact sheets that you were given: MRSA Information       Questions please call  Karie Chimera RN     [phone]    Patient signature _______________________________________  Nurse signature ________________________________________

## 2013-11-28 ENCOUNTER — Ambulatory Visit (HOSPITAL_COMMUNITY): Payer: Medicare Other

## 2013-11-28 ENCOUNTER — Encounter (HOSPITAL_COMMUNITY)
Admission: RE | Disposition: A | Discharge status: Home / Self Care | Payer: Self-pay | Source: Ambulatory Visit | Attending: Surgery

## 2013-11-28 ENCOUNTER — Encounter (HOSPITAL_COMMUNITY): Payer: Self-pay

## 2013-11-28 ENCOUNTER — Observation Stay (HOSPITAL_COMMUNITY)
Admission: RE | Admit: 2013-11-28 | Discharge: 2013-11-29 | Disposition: A | Discharge status: Home / Self Care | Payer: Medicare Other | Source: Ambulatory Visit | Attending: Surgery | Admitting: Surgery

## 2013-11-28 ENCOUNTER — Ambulatory Visit (HOSPITAL_COMMUNITY): Payer: Medicare Other | Admitting: Anesthesiology

## 2013-11-28 ENCOUNTER — Encounter (HOSPITAL_COMMUNITY): Payer: Medicare Other | Admitting: Anesthesiology

## 2013-11-28 DIAGNOSIS — Z23 Encounter for immunization: Secondary | ICD-10-CM | POA: Insufficient documentation

## 2013-11-28 DIAGNOSIS — K219 Gastro-esophageal reflux disease without esophagitis: Secondary | ICD-10-CM | POA: Insufficient documentation

## 2013-11-28 DIAGNOSIS — K8010 Calculus of gallbladder with chronic cholecystitis without obstruction: Principal | ICD-10-CM | POA: Diagnosis present

## 2013-11-28 DIAGNOSIS — K859 Acute pancreatitis without necrosis or infection, unspecified: Secondary | ICD-10-CM | POA: Diagnosis present

## 2013-11-28 DIAGNOSIS — I10 Essential (primary) hypertension: Secondary | ICD-10-CM | POA: Insufficient documentation

## 2013-11-28 HISTORY — PX: CHOLECYSTECTOMY: SHX55

## 2013-11-28 SURGERY — LAPAROSCOPIC CHOLECYSTECTOMY WITH INTRAOPERATIVE CHOLANGIOGRAM
Anesthesia: General

## 2013-11-28 MED ORDER — ONDANSETRON HCL 4 MG/2ML IJ SOLN
INTRAMUSCULAR | Status: DC | PRN
  Administered 2013-11-28: 4 mg via INTRAVENOUS

## 2013-11-28 MED ORDER — NEOSTIGMINE METHYLSULFATE 1 MG/ML IJ SOLN
INTRAMUSCULAR | Status: DC | PRN
  Administered 2013-11-28: 4 mg via INTRAVENOUS

## 2013-11-28 MED ORDER — DEXAMETHASONE SODIUM PHOSPHATE 10 MG/ML IJ SOLN
INTRAMUSCULAR | Status: DC | PRN
  Administered 2013-11-28: 10 mg via INTRAVENOUS

## 2013-11-28 MED ORDER — PNEUMOCOCCAL VAC POLYVALENT 25 MCG/0.5ML IJ INJ
0.5000 mL | INJECTION | INTRAMUSCULAR | Status: AC
  Administered 2013-11-29: 0.5 mL via INTRAMUSCULAR
  Filled 2013-11-28 (×2): qty 0.5

## 2013-11-28 MED ORDER — PROPOFOL 10 MG/ML IV BOLUS
INTRAVENOUS | Status: AC
  Filled 2013-11-28: qty 20

## 2013-11-28 MED ORDER — HYDROMORPHONE HCL PF 1 MG/ML IJ SOLN
INTRAMUSCULAR | Status: AC
  Filled 2013-11-28: qty 1

## 2013-11-28 MED ORDER — KCL IN DEXTROSE-NACL 20-5-0.45 MEQ/L-%-% IV SOLN
INTRAVENOUS | Status: DC
  Administered 2013-11-28: 16:00:00 via INTRAVENOUS
  Filled 2013-11-28 (×3): qty 1000

## 2013-11-28 MED ORDER — LIDOCAINE HCL (PF) 2 % IJ SOLN
INTRAMUSCULAR | Status: DC | PRN
  Administered 2013-11-28: 75 mg via INTRADERMAL

## 2013-11-28 MED ORDER — LACTATED RINGERS IV SOLN
INTRAVENOUS | Status: DC | PRN
  Administered 2013-11-28: 1000 mL via INTRAVENOUS

## 2013-11-28 MED ORDER — FENTANYL CITRATE 0.05 MG/ML IJ SOLN
INTRAMUSCULAR | Status: DC | PRN
  Administered 2013-11-28: 50 ug via INTRAVENOUS
  Administered 2013-11-28 (×3): 100 ug via INTRAVENOUS

## 2013-11-28 MED ORDER — HYDROMORPHONE HCL PF 1 MG/ML IJ SOLN
0.2500 mg | INTRAMUSCULAR | Status: DC | PRN
  Administered 2013-11-28: 0.25 mg via INTRAVENOUS

## 2013-11-28 MED ORDER — FENTANYL CITRATE 0.05 MG/ML IJ SOLN
INTRAMUSCULAR | Status: AC
  Filled 2013-11-28: qty 5

## 2013-11-28 MED ORDER — FENTANYL CITRATE 0.05 MG/ML IJ SOLN
INTRAMUSCULAR | Status: AC
  Filled 2013-11-28: qty 2

## 2013-11-28 MED ORDER — BUPIVACAINE-EPINEPHRINE 0.5% -1:200000 IJ SOLN
INTRAMUSCULAR | Status: DC | PRN
  Administered 2013-11-28: 20 mL

## 2013-11-28 MED ORDER — MIDAZOLAM HCL 5 MG/5ML IJ SOLN
INTRAMUSCULAR | Status: DC | PRN
  Administered 2013-11-28: 2 mg via INTRAVENOUS

## 2013-11-28 MED ORDER — ONDANSETRON HCL 4 MG PO TABS: 4.0000 mg | ORAL_TABLET | Freq: Four times a day (QID) | ORAL | Status: DC | PRN

## 2013-11-28 MED ORDER — ROCURONIUM BROMIDE 100 MG/10ML IV SOLN
INTRAVENOUS | Status: AC
  Filled 2013-11-28: qty 1

## 2013-11-28 MED ORDER — CEFAZOLIN SODIUM-DEXTROSE 2-3 GM-% IV SOLR
2.0000 g | INTRAVENOUS | Status: AC
  Administered 2013-11-28: 2 g via INTRAVENOUS

## 2013-11-28 MED ORDER — LACTATED RINGERS IV SOLN
INTRAVENOUS | Status: DC
  Administered 2013-11-28: 1000 mL via INTRAVENOUS
  Administered 2013-11-28: 12:00:00 via INTRAVENOUS

## 2013-11-28 MED ORDER — ACETAMINOPHEN 325 MG PO TABS: 650.0000 mg | ORAL_TABLET | ORAL | Status: DC | PRN

## 2013-11-28 MED ORDER — ONDANSETRON HCL 4 MG/2ML IJ SOLN: 4.0000 mg | Freq: Four times a day (QID) | INTRAMUSCULAR | Status: DC | PRN

## 2013-11-28 MED ORDER — LIDOCAINE HCL (CARDIAC) 20 MG/ML IV SOLN
INTRAVENOUS | Status: AC
  Filled 2013-11-28: qty 5

## 2013-11-28 MED ORDER — GLYCOPYRROLATE 0.2 MG/ML IJ SOLN
INTRAMUSCULAR | Status: DC | PRN
  Administered 2013-11-28: 0.6 mg via INTRAVENOUS

## 2013-11-28 MED ORDER — DEXAMETHASONE SODIUM PHOSPHATE 10 MG/ML IJ SOLN
INTRAMUSCULAR | Status: AC
  Filled 2013-11-28: qty 1

## 2013-11-28 MED ORDER — 0.9 % SODIUM CHLORIDE (POUR BTL) OPTIME
TOPICAL | Status: DC | PRN
  Administered 2013-11-28: 1000 mL

## 2013-11-28 MED ORDER — HYDROMORPHONE HCL PF 1 MG/ML IJ SOLN
1.0000 mg | INTRAMUSCULAR | Status: DC | PRN
  Administered 2013-11-28 – 2013-11-29 (×2): 1 mg via INTRAVENOUS
  Filled 2013-11-28 (×2): qty 1

## 2013-11-28 MED ORDER — CEFAZOLIN SODIUM-DEXTROSE 2-3 GM-% IV SOLR
INTRAVENOUS | Status: AC
  Filled 2013-11-28: qty 50

## 2013-11-28 MED ORDER — NEOSTIGMINE METHYLSULFATE 1 MG/ML IJ SOLN
INTRAMUSCULAR | Status: AC
  Filled 2013-11-28: qty 10

## 2013-11-28 MED ORDER — PROPOFOL 10 MG/ML IV BOLUS
INTRAVENOUS | Status: DC | PRN
  Administered 2013-11-28: 150 mg via INTRAVENOUS

## 2013-11-28 MED ORDER — IOHEXOL 300 MG/ML  SOLN
INTRAMUSCULAR | Status: DC | PRN
  Administered 2013-11-28: 50 mL via INTRAVENOUS

## 2013-11-28 MED ORDER — HYDROCODONE-ACETAMINOPHEN 5-325 MG PO TABS
1.0000 | ORAL_TABLET | ORAL | Status: DC | PRN
  Administered 2013-11-29: 1 via ORAL
  Filled 2013-11-28: qty 1

## 2013-11-28 MED ORDER — BUPIVACAINE-EPINEPHRINE PF 0.5-1:200000 % IJ SOLN
INTRAMUSCULAR | Status: AC
  Filled 2013-11-28: qty 30

## 2013-11-28 MED ORDER — PROMETHAZINE HCL 25 MG/ML IJ SOLN: 6.2500 mg | INTRAMUSCULAR | Status: DC | PRN

## 2013-11-28 MED ORDER — MIDAZOLAM HCL 2 MG/2ML IJ SOLN
INTRAMUSCULAR | Status: AC
  Filled 2013-11-28: qty 2

## 2013-11-28 MED ORDER — HYDROCHLOROTHIAZIDE 25 MG PO TABS
25.0000 mg | ORAL_TABLET | Freq: Every morning | ORAL | Status: DC
  Administered 2013-11-29: 25 mg via ORAL
  Filled 2013-11-28: qty 1

## 2013-11-28 MED ORDER — ONDANSETRON HCL 4 MG/2ML IJ SOLN
INTRAMUSCULAR | Status: AC
  Filled 2013-11-28: qty 2

## 2013-11-28 MED ORDER — ROCURONIUM BROMIDE 100 MG/10ML IV SOLN
INTRAVENOUS | Status: DC | PRN
  Administered 2013-11-28: 50 mg via INTRAVENOUS

## 2013-11-28 SURGICAL SUPPLY — 38 items
APL SKNCLS STERI-STRIP NONHPOA (GAUZE/BANDAGES/DRESSINGS) ×1
APPLIER CLIP ROT 10 11.4 M/L (STAPLE) ×3
APR CLP MED LRG 11.4X10 (STAPLE) ×1
BAG SPEC RTRVL LRG 6X4 10 (ENDOMECHANICALS) ×1
BENZOIN TINCTURE PRP APPL 2/3 (GAUZE/BANDAGES/DRESSINGS) ×3 IMPLANT
CABLE HIGH FREQUENCY MONO STRZ (ELECTRODE) ×3 IMPLANT
CANISTER SUCTION 2500CC (MISCELLANEOUS) ×1 IMPLANT
CHLORAPREP W/TINT 26ML (MISCELLANEOUS) ×3 IMPLANT
CLIP APPLIE ROT 10 11.4 M/L (STAPLE) ×1 IMPLANT
CLOSURE WOUND 1/2 X4 (GAUZE/BANDAGES/DRESSINGS) ×1
COVER MAYO STAND STRL (DRAPES) ×3 IMPLANT
DECANTER SPIKE VIAL GLASS SM (MISCELLANEOUS) ×3 IMPLANT
DRAPE C-ARM 42X120 X-RAY (DRAPES) ×3 IMPLANT
DRAPE LAPAROSCOPIC ABDOMINAL (DRAPES) ×3 IMPLANT
DRAPE UTILITY XL STRL (DRAPES) ×3 IMPLANT
ELECT REM PT RETURN 9FT ADLT (ELECTROSURGICAL) ×3
ELECTRODE REM PT RTRN 9FT ADLT (ELECTROSURGICAL) ×1 IMPLANT
GLOVE SURG ORTHO 8.0 STRL STRW (GLOVE) ×3 IMPLANT
GOWN STRL REUS W/TWL XL LVL3 (GOWN DISPOSABLE) ×6 IMPLANT
HEMOSTAT SURGICEL 4X8 (HEMOSTASIS) IMPLANT
KIT BASIN OR (CUSTOM PROCEDURE TRAY) ×3 IMPLANT
MANIFOLD NEPTUNE II (INSTRUMENTS) ×2 IMPLANT
NS IRRIG 1000ML POUR BTL (IV SOLUTION) ×3 IMPLANT
POUCH SPECIMEN RETRIEVAL 10MM (ENDOMECHANICALS) ×3 IMPLANT
SCISSORS LAP 5X35 DISP (ENDOMECHANICALS) ×3 IMPLANT
SET CHOLANGIOGRAPH MIX (MISCELLANEOUS) ×3 IMPLANT
SET IRRIG TUBING LAPAROSCOPIC (IRRIGATION / IRRIGATOR) ×3 IMPLANT
SLEEVE XCEL OPT CAN 5 100 (ENDOMECHANICALS) ×3 IMPLANT
SOLUTION ANTI FOG 6CC (MISCELLANEOUS) ×3 IMPLANT
STRIP CLOSURE SKIN 1/2X4 (GAUZE/BANDAGES/DRESSINGS) ×2 IMPLANT
SUT MNCRL AB 4-0 PS2 18 (SUTURE) ×3 IMPLANT
TOWEL OR 17X26 10 PK STRL BLUE (TOWEL DISPOSABLE) ×3 IMPLANT
TOWEL OR NON WOVEN STRL DISP B (DISPOSABLE) ×3 IMPLANT
TRAY LAP CHOLE (CUSTOM PROCEDURE TRAY) ×3 IMPLANT
TROCAR BLADELESS OPT 5 100 (ENDOMECHANICALS) ×3 IMPLANT
TROCAR XCEL BLUNT TIP 100MML (ENDOMECHANICALS) ×3 IMPLANT
TROCAR XCEL NON-BLD 11X100MML (ENDOMECHANICALS) ×3 IMPLANT
TUBING INSUFFLATION 10FT LAP (TUBING) ×3 IMPLANT

## 2013-11-28 NOTE — Op Note (Signed)
Procedure Note  Pre-operative Diagnosis:  Cholelithiasis, chronic cholecystitis, history of biliary pancreatitis  Post-operative Diagnosis:  same  Surgeon:  Earnstine Regal, MD, FACS  Assistant:  none   Procedure:  Laparoscopic cholecystectomy with intra-operative cholangiography  Anesthesia:  General  Estimated Blood Loss:  minimal  Drains: none         Specimen: Gallbladder to pathology  Indications:  Patient is a 68 year old female referred by her primary care physician for known cholelithiasis and recent episode of biliary colic complicated by pancreatitis. Patient experienced onset of sudden abdominal pain. This radiated to the back. It was associated with nausea and vomiting. Patient denies fever but did have chills. She denies jaundice or acholic stools. Laboratory studies showed a normal CBC, normal liver function tests, but an elevated lipase level of 111. Patient had had a previous ultrasound in 2013 which documented gallstones. Patient improved symptomatically.  Repeat ultrasound shows gallstones and no biliary dilatation.  Now for cholecystectomy.   Procedure Details:  The patient was seen in the pre-op holding area. The risks, benefits, complications, treatment options, and expected outcomes have been discussed with the patient. The patient agreed with the proposed plan and signed the informed consent form.  The patient was taken to Operating Room, identified as Pamela Blake and the procedure verified as Laparoscopic Cholecystectomy with Intraoperative Cholangiogram. A "time out" was completed and the above information confirmed.  Following induction of general anesthesia, the patient was placed in the supine position. The abdomen was prepped and draped in the usual aseptic fashion.  An incision was made in the skin below the umbilicus. The midline fascia was incised and the peritoneal cavity entered and the Hasson canula was introduced under direct vision.  The Hasson  canula was secured with a 0-Vicryl pursestring suture. Pneumoperitoneum was established with carbon dioxide. Additional trocars were introduced under direct vision along the right costal margin in the midline, mid-clavicular line, and anterior axillary line.   The gallbladder was identified and the fundus grasped and retracted cephalad. Adhesions were taken down bluntly and the electrocautery was utilized as needed, taking care not to injure any adjacent structures. The infundibulum was grasped and retracted laterally, exposing the peritoneum overlying the triangle of Calot. The peritoneum was incised and structures exposed with blunt dissection. The cystic duct was clearly identified, bluntly dissected circumferentially, and clipped at the neck of the gallbladder.  An incision was made in the cystic duct and the cholangiogram catheter introduced. The catheter was secured using an ligaclip.  Real-time cholangiography was performed using C-arm fluoroscopy.  There was rapid filling of a normal caliber common bile duct.  There was reflux of contrast into the left and right hepatic ductal systems.  There was free flow distally into the duodenum without filling defect or obstruction.  Catheter was removed from the peritoneal cavity.  The cystic duct was then triply ligated with surgical clips and divided. The cystic artery was identified, dissected circumferentially, ligated with ligaclips, and divided.  The gallbladder was dissected away from the liver bed using the electrocautery for hemostasis. The gallbladder was completely removed from the liver and placed into an endocatch bag. The right upper quadrant was irrigated and the gallbladder bed was inspected. Hemostasis was achieved with the electrocautery. Warm saline irrigation was utilized until clear.  Pneumoperitoneum was released after viewing removal of the trocars with good hemostasis noted. The umbilical wound was irrigated and the fascia was then  closed with the pursestring suture.  Local anesthetic was  infiltrated at all port sites. The skin incisions were closed with 4-0 Monocril subcuticular sutures and steri-strips and dressings were applied.  Instrument, sponge, and needle counts were correct at the conclusion of the case.  The patient was awakened from anesthesia and brought to the recovery room in stable condition.  The patient tolerated the procedure well.   Earnstine Regal, MD, [facility] Office: [phone]

## 2013-11-28 NOTE — Transfer of Care (Signed)
Immediate Anesthesia Transfer of Care Note  Patient: Pamela Blake  Procedure(s) Performed: Procedure(s): LAPAROSCOPIC CHOLECYSTECTOMY WITH INTRAOPERATIVE CHOLANGIOGRAM (N/A)  Patient Location: PACU  Anesthesia Type:General  Level of Consciousness: awake and sedated  Airway & Oxygen Therapy: Patient Spontanous Breathing, Patient connected to face mask oxygen and patient encouraged to take deep breaths.  Post-op Assessment: Report given to PACU RN, Post -op Vital signs reviewed and stable and pulse oximeter into 90's with deep breaths.  Post vital signs: Reviewed and stable  Complications: No apparent anesthesia complications

## 2013-11-28 NOTE — Anesthesia Preprocedure Evaluation (Addendum)
Anesthesia Evaluation  Patient identified by MRN, date of birth, ID band Patient awake    Reviewed: Allergy & Precautions, H&P , NPO status , Patient's Chart, lab work & pertinent test results  Airway Mallampati: II TM Distance: >3 FB Neck ROM: Full    Dental  (+) Loose, Dental Advisory Given, Poor Dentition and Partial Upper Upper partial out. She states some of her upper front teeth are loose. :   Pulmonary shortness of breath,  breath sounds clear to auscultation  Pulmonary exam normal       Cardiovascular hypertension, Pt. on medications negative cardio ROS  Rhythm:Regular Rate:Normal     Neuro/Psych  Headaches, negative psych ROS   GI/Hepatic Neg liver ROS, GERD-  ,  Endo/Other  negative endocrine ROS  Renal/GU negative Renal ROS  negative genitourinary   Musculoskeletal negative musculoskeletal ROS (+)   Abdominal   Peds negative pediatric ROS (+)  Hematology negative hematology ROS (+)   Anesthesia Other Findings   Reproductive/Obstetrics negative OB ROS                          Anesthesia Physical Anesthesia Plan  ASA: II  Anesthesia Plan: General   Post-op Pain Management:    Induction: Intravenous  Airway Management Planned: Oral ETT  Additional Equipment:   Intra-op Plan:   Post-operative Plan: Extubation in OR  Informed Consent: I have reviewed the patients History and Physical, chart, labs and discussed the procedure including the risks, benefits and alternatives for the proposed anesthesia with the patient or authorized representative who has indicated his/her understanding and acceptance.   Dental advisory given  Plan Discussed with: CRNA  Anesthesia Plan Comments:         Anesthesia Quick Evaluation

## 2013-11-28 NOTE — H&P (View-Only) (Signed)
General Surgery [facility]  Chief Complaint  Patient presents with  . New Evaluation    symptomatic gallstones, pancreatitis - referral from Dr. Dala Dock @ [employer]    HISTORY: Patient is a 68 year old female referred by her primary care physician for known cholelithiasis and recent episode of biliary colic complicated by pancreatitis. Patient experienced onset last week of sudden abdominal pain. This radiated to the back. It was associated with nausea and vomiting. Patient denies fever but did have chills. She denies jaundice or acholic stools. Laboratory studies showed a normal CBC, normal liver function tests, but an elevated lipase level of 111. Patient had had a previous ultrasound in 2013 which documented gallstones. Patient improved symptomatically. No further diagnostic studies were performed. Patient is referred to surgery for consideration for cholecystectomy.  Previous abdominal surgery includes a total abdominal hysterectomy and a previous umbilical hernia repair.  Family history is notable for cholecystectomy and the patient's daughter.   History reviewed. No pertinent past medical history.  Current Outpatient Prescriptions  Medication Sig Dispense Refill  . calcium-vitamin D (OSCAL) 250-125 MG-UNIT per tablet Take 1 tablet by mouth daily.      . hydrochlorothiazide 25 MG tablet TAKE 1 TABLET EVERY MORNING  100 tablet  0   No current facility-administered medications for this visit.    No Known Allergies  History reviewed. No pertinent family history.  History   Social History  . Marital Status: Married    Spouse Name: N/A    Number of Children: N/A  . Years of Education: N/A   Social History Main Topics  . Smoking status: Never Smoker   . Smokeless tobacco: None  . Alcohol Use: None  . Drug Use: None  . Sexual Activity: None   Other Topics Concern  . None   Social History Narrative  . None    REVIEW OF SYSTEMS -  PERTINENT POSITIVES ONLY: Denies hepatobiliary or pancreatic disease in the past. Denies jaundice. Denies acholic stools. Denies fever.  EXAM: Filed Vitals:   11/10/13 1118  BP: 126/80  Pulse: 76  Temp: 98 F (36.7 C)  Resp: 18    GENERAL: well-developed, well-nourished, no acute distress HEENT: normocephalic; pupils equal and reactive; sclerae clear; dentition good; mucous membranes moist NECK:  Palpable 1 cm nodule left thyroid lobe; symmetric on extension; no palpable anterior or posterior cervical lymphadenopathy; no supraclavicular masses; no tenderness CHEST: clear to auscultation bilaterally without rales, rhonchi, or wheezes CARDIAC: regular rate and rhythm without significant murmur; peripheral pulses are full ABDOMEN: soft without distension; bowel sounds present; no mass; no hepatosplenomegaly; no hernia; mild diffuse tenderness upper abdomen without mass or guarding EXT:  non-tender without edema; no deformity NEURO: no gross focal deficits; no sign of tremor   LABORATORY RESULTS: See [facility] (CHL-Epic) for most recent results  RADIOLOGY RESULTS: See [facility] (CHL-Epic) for most recent results  IMPRESSION: #1 symptomatic cholelithiasis #2 recent acute pancreatitis, likely of biliary origin  PLAN: I discussed the above findings at length with the patient and her husband. I would like to obtain an abdominal ultrasound to assess her gallbladder, bile ducts, and pancreas. Patient and I discussed cholecystectomy. We discussed risk and benefits of laparoscopic cholecystectomy and the possibility of conversion to open surgery. We discussed the hospital stay to be anticipated. She would like to proceed with cholecystectomy in the near future. We will make these arrangements.  I will contact the patient with the results of her abdominal  ultrasound is sent as they are available.  The risks and benefits of the procedure have been discussed at length with the  patient.  The patient understands the proposed procedure, potential alternative treatments, and the course of recovery to be expected.  All of the patient's questions have been answered at this time.  The patient wishes to proceed with surgery.  Earnstine Regal, MD, [facility]  Primary Care Physician: Gavin Pound, MD

## 2013-11-28 NOTE — Interval H&P Note (Signed)
History and Physical Interval Note:  11/28/2013 10:11 AM  Pamela Blake  has presented today for surgery, with the diagnosis of gallstones pancreatitis.  The various methods of treatment have been discussed with the patient and family. After consideration of risks, benefits and other options for treatment, the patient has consented to    Procedure(s): LAPAROSCOPIC CHOLECYSTECTOMY WITH INTRAOPERATIVE CHOLANGIOGRAM (N/A) as a surgical intervention .    The patient's history has been reviewed, patient examined, no change in status, stable for surgery.  I have reviewed the patient's chart and labs.  Questions were answered to the patient's satisfaction.    Earnstine Regal, MD, [facility] Office: [place]

## 2013-11-28 NOTE — Anesthesia Postprocedure Evaluation (Signed)
  Anesthesia Post-op Note  Patient: Pamela Blake  Procedure(s) Performed: Procedure(s) (LRB): LAPAROSCOPIC CHOLECYSTECTOMY WITH INTRAOPERATIVE CHOLANGIOGRAM (N/A)  Patient Location: PACU  Anesthesia Type: General  Level of Consciousness: awake and alert   Airway and Oxygen Therapy: Patient Spontanous Breathing  Post-op Pain: mild  Post-op Assessment: Post-op Vital signs reviewed, Patient's Cardiovascular Status Stable, Respiratory Function Stable, Patent Airway and No signs of Nausea or vomiting  Last Vitals:  Filed Vitals:   11/28/13 1400  BP: 136/67  Pulse: 67  Temp: 36.7 C  Resp: 11    Post-op Vital Signs: stable   Complications: No apparent anesthesia complications

## 2013-11-29 ENCOUNTER — Encounter (HOSPITAL_COMMUNITY): Payer: Self-pay | Admitting: Surgery

## 2013-11-29 ENCOUNTER — Telehealth (INDEPENDENT_AMBULATORY_CARE_PROVIDER_SITE_OTHER): Payer: Self-pay

## 2013-11-29 MED ORDER — HYDROCODONE-ACETAMINOPHEN 5-325 MG PO TABS: 1.0000 | ORAL_TABLET | ORAL | Status: DC | PRN

## 2013-11-29 NOTE — Telephone Encounter (Signed)
Pt home doing well. Pts husband given po appt date.

## 2013-11-29 NOTE — Discharge Summary (Signed)
Physician Discharge Summary [facility]  Patient ID: Pamela Blake MRN: [id number] DOB/AGE: 03-10-1946 68 y.o.  Admit date: 11/28/2013 Discharge date: 11/29/2013  Admission Diagnoses:  Cholelithiasis, chronic cholecystitis  Discharge Diagnoses:  Principal Problem:   Cholelithiasis with cholecystitis Active Problems:   Pancreatitis, acute   Discharged Condition: good  Hospital Course: Patient was admitted for observation following cholecystectomy.  Post op course was uncomplicated.  Pain was well controlled.  Tolerated diet.  Patient was prepared for discharge home on POD#1.  Consults: None  Significant Diagnostic Studies: none  Treatments: surgery: lap chole with IOC  Discharge Exam: Blood pressure 146/71, pulse 74, temperature 98 F (36.7 C), temperature source Oral, resp. rate 16, height 5\' 9"  (1.753 m), weight 195 lb (88.451 kg), SpO2 98.00%. HEENT - clear Neck - soft Chest - clear bilaterally Cor - RRR Abd - soft without distension; dressings dry and intact   Disposition: Home with family  Discharge Orders   Future Orders Complete By Expires   Diet - low sodium heart healthy  As directed    Discharge instructions  As directed    Comments:     [facility]  LAPAROSCOPIC SURGERY - POST-OP INSTRUCTIONS  Always review your discharge instruction sheet given to you by the facility where your surgery was performed.  A prescription for pain medication may be given to you upon discharge.  Take your pain medication as prescribed.  If narcotic pain medicine is not needed, then you may take acetaminophen (Tylenol) or ibuprofen (Advil) as needed.  Take your usually prescribed medications unless otherwise directed.  If you need a refill on your pain medication, please contact your pharmacy.  They will contact our office to request authorization. Prescriptions will not be filled after 5 P.M. or on weekends.  You should follow a  light diet the first few days after arrival home, such as soup and crackers or toast.  Be sure to include plenty of fluids daily.  Most patients will experience some swelling and bruising in the area of the incisions.  Ice packs will help.  Swelling and bruising can take several days to resolve.   It is common to experience some constipation if taking pain medication after surgery.  Increasing fluid intake and taking a stool softener (such as Colace) will usually help or prevent this problem from occurring.  A mild laxative (Milk of Magnesia or Miralax) should be taken according to package instructions if there are no bowel movements after 48 hours.  Unless discharge instructions indicate otherwise, you may remove your bandages 24-48 hours after surgery, and you may shower at that time.  You may have steri-strips (small skin tapes) in place directly over the incision.  These strips should be left on the skin for 7-10 days.  If your surgeon used skin glue on the incision, you may shower in 24 hours.  The glue will flake off over the next 2-3 weeks.  Any sutures or staples will be removed at the office during your follow-up visit.  ACTIVITIES:  You may resume regular (light) daily activities beginning the next day-such as daily self-care, walking, climbing stairs-gradually increasing activities as tolerated.  You may have sexual intercourse when it is comfortable.  Refrain from any heavy lifting or straining until approved by your doctor.  You may drive when you are no longer taking prescription pain medication, you can comfortably wear a seatbelt, and you can safely maneuver your car and apply brakes.  You  should see your doctor in the office for a follow-up appointment approximately 2-3 weeks after your surgery.  Make sure that you call for this appointment within a day or two after you arrive home to insure a convenient appointment time.  WHEN TO CALL YOUR DOCTOR: Fever over 101.0 Inability to  urinate Continued bleeding from incision Increased pain, redness, or drainage from the incision Increasing abdominal pain  The clinic staff is available to answer your questions during regular business hours.  Please don't hesitate to call and ask to speak to one of the nurses for clinical concerns.  If you have a medical emergency, go to the nearest emergency room or call 911.  A surgeon from [facility] is always on call for the hospital.  Earnstine Regal, MD, [facility] Office: [place] Free:  [phone] FAX [phone]  Web site: [email or web address]   Increase activity slowly  As directed    Remove dressing in 24 hours  As directed        Medication List         calcium-vitamin D 250-125 MG-UNIT per tablet  Commonly known as:  OSCAL  Take 1 tablet by mouth daily.     hydrochlorothiazide 25 MG tablet  Commonly known as:  HYDRODIURIL  Take 25 mg by mouth every morning.     HYDROcodone-acetaminophen 5-325 MG per tablet  Commonly known as:  NORCO/VICODIN  Take 1-2 tablets by mouth every 4 (four) hours as needed for moderate pain.     VITRON-C PO  Take 1 tablet by mouth daily.           Follow-up Information   Follow up with Earnstine Regal, MD. Schedule an appointment as soon as possible for a visit in 3 weeks.   Specialty:  General Surgery   Contact information:   [address] [place] [address] [phone]       Earnstine Regal, MD, [facility] Office: [phone]   Signed: Earnstine Regal 11/29/2013, 7:38 AM

## 2013-12-12 ENCOUNTER — Ambulatory Visit (INDEPENDENT_AMBULATORY_CARE_PROVIDER_SITE_OTHER): Payer: Medicare Other | Admitting: Surgery

## 2013-12-12 ENCOUNTER — Encounter (INDEPENDENT_AMBULATORY_CARE_PROVIDER_SITE_OTHER): Payer: Self-pay

## 2013-12-12 ENCOUNTER — Encounter (INDEPENDENT_AMBULATORY_CARE_PROVIDER_SITE_OTHER): Payer: Self-pay | Admitting: Surgery

## 2013-12-12 DIAGNOSIS — K8010 Calculus of gallbladder with chronic cholecystitis without obstruction: Secondary | ICD-10-CM

## 2013-12-12 DIAGNOSIS — K859 Acute pancreatitis without necrosis or infection, unspecified: Secondary | ICD-10-CM

## 2013-12-12 NOTE — Progress Notes (Signed)
General Surgery [facility]  Chief Complaint  Patient presents with  . Routine Post Op    lap chole with IOC - 11/28/2013    HISTORY: Patient is a 68 year old female who underwent laparoscopic cholecystectomy with intraoperative cholangiography on 11/28/2013. Postoperative course was uneventful. 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  EXAM: Abdominal incisions are well-healed. No sign of herniation. No sign of infection. Right upper quadrant is soft with minimal tenderness. No palpable masses.  IMPRESSION: Status post laparoscopic cholecystectomy  PLAN: Patient is released to full activity without restriction. We will give her return to work slip for 12/18/2013.  Patient will return for surgical care as needed.  Earnstine Regal, MD, [facility]   Visit Diagnoses: 1. Cholelithiasis with cholecystitis   2. Pancreatitis, acute

## 2013-12-12 NOTE — Patient Instructions (Signed)
  COCOA BUTTER & VITAMIN E CREAM  ([employer]mer[employer] or other brand)  Apply cocoa butter/vitamin E cream to your incision 2 - 3 times daily.  Massage cream into incision for one minute with each application.  Use sunscreen (50 SPF or higher) for first 6 months after surgery if area is exposed to sun.  You may substitute Mederma or other scar reducing creams as desired.   

## 2014-01-26 ENCOUNTER — Encounter (INDEPENDENT_AMBULATORY_CARE_PROVIDER_SITE_OTHER): Payer: Self-pay

## 2014-04-19 ENCOUNTER — Other Ambulatory Visit: Payer: Self-pay

## 2014-04-19 DIAGNOSIS — Z1231 Encounter for screening mammogram for malignant neoplasm of breast: Secondary | ICD-10-CM

## 2014-05-02 ENCOUNTER — Ambulatory Visit
Admission: RE | Admit: 2014-05-02 | Discharge: 2014-05-02 | Disposition: A | Discharge status: Home / Self Care | Payer: Medicare Other | Source: Ambulatory Visit

## 2014-05-02 DIAGNOSIS — Z1231 Encounter for screening mammogram for malignant neoplasm of breast: Secondary | ICD-10-CM

## 2014-08-21 ENCOUNTER — Other Ambulatory Visit: Payer: Self-pay | Admitting: Family Medicine

## 2014-08-21 DIAGNOSIS — M79605 Pain in left leg: Secondary | ICD-10-CM

## 2014-08-22 ENCOUNTER — Ambulatory Visit
Admission: RE | Admit: 2014-08-22 | Discharge: 2014-08-22 | Disposition: A | Discharge status: Home / Self Care | Payer: Medicare Other | Source: Ambulatory Visit | Attending: Family Medicine | Admitting: Family Medicine

## 2014-08-22 DIAGNOSIS — M79605 Pain in left leg: Secondary | ICD-10-CM

## 2016-05-23 ENCOUNTER — Encounter (HOSPITAL_COMMUNITY): Payer: Self-pay | Admitting: Emergency Medicine

## 2016-05-23 ENCOUNTER — Emergency Department (HOSPITAL_COMMUNITY)
Admission: EM | Admit: 2016-05-23 | Discharge: 2016-05-24 | Disposition: A | Discharge status: Home / Self Care | Payer: Medicare Other | Attending: Emergency Medicine | Admitting: Emergency Medicine

## 2016-05-23 DIAGNOSIS — R110 Nausea: Secondary | ICD-10-CM

## 2016-05-23 DIAGNOSIS — I10 Essential (primary) hypertension: Secondary | ICD-10-CM | POA: Insufficient documentation

## 2016-05-23 DIAGNOSIS — R197 Diarrhea, unspecified: Secondary | ICD-10-CM | POA: Diagnosis present

## 2016-05-23 DIAGNOSIS — R112 Nausea with vomiting, unspecified: Secondary | ICD-10-CM | POA: Insufficient documentation

## 2016-05-23 DIAGNOSIS — Z79899 Other long term (current) drug therapy: Secondary | ICD-10-CM | POA: Diagnosis not present

## 2016-05-23 DIAGNOSIS — R195 Other fecal abnormalities: Secondary | ICD-10-CM

## 2016-05-23 LAB — COMPREHENSIVE METABOLIC PANEL
ALBUMIN: 3.8 g/dL (ref 3.5–5.0)
ALT: 25 U/L (ref 14–54)
ANION GAP: 8 (ref 5–15)
AST: 24 U/L (ref 15–41)
Alkaline Phosphatase: 60 U/L (ref 38–126)
BILIRUBIN TOTAL: 0.7 mg/dL (ref 0.3–1.2)
BUN: 15 mg/dL (ref 6–20)
CO2: 27 mmol/L (ref 22–32)
Calcium: 8.9 mg/dL (ref 8.9–10.3)
Chloride: 102 mmol/L (ref 101–111)
Creatinine, Ser: 1.04 mg/dL — ABNORMAL HIGH (ref 0.44–1.00)
GFR, EST NON AFRICAN AMERICAN: 54 mL/min — AB (ref >60)
Glucose, Bld: 85 mg/dL (ref 65–99)
POTASSIUM: 3.8 mmol/L (ref 3.5–5.1)
Sodium: 137 mmol/L (ref 135–145)
TOTAL PROTEIN: 7.3 g/dL (ref 6.5–8.1)

## 2016-05-23 LAB — LIPASE, BLOOD: Lipase: 21 U/L (ref 11–51)

## 2016-05-23 LAB — CBC
HEMATOCRIT: 32.4 % — AB (ref 36.0–46.0)
HEMOGLOBIN: 10.9 g/dL — AB (ref 12.0–15.0)
MCH: 28.5 pg (ref 26.0–34.0)
MCHC: 33.6 g/dL (ref 30.0–36.0)
MCV: 84.8 fL (ref 78.0–100.0)
Platelets: 183 10*3/uL (ref 150–400)
RBC: 3.82 MIL/uL — ABNORMAL LOW (ref 3.87–5.11)
RDW: 14.7 % (ref 11.5–15.5)
WBC: 3 10*3/uL — AB (ref 4.0–10.5)

## 2016-05-23 MED ORDER — SODIUM CHLORIDE 0.9 % IV BOLUS (SEPSIS)
1000.0000 mL | Freq: Once | INTRAVENOUS | Status: AC
  Administered 2016-05-23: 1000 mL via INTRAVENOUS

## 2016-05-23 NOTE — ED Notes (Signed)
Patient went to restroom prior to coming to the hospital. She attempted to void here for a urine specimen, but was unsuccessful.

## 2016-05-23 NOTE — ED Provider Notes (Signed)
[facility] DEPT Provider Note   CSN: [id number] Arrival date & time: 05/23/16  2044  First Provider Contact:  First MD Initiated Contact with Patient 05/23/16 2303        History   Chief Complaint Chief Complaint  Patient presents with  . Nausea  . Emesis  . Diarrhea    HPI Pamela Blake is a 70 y.o. female.  The history is provided by the patient and a relative.  Emesis   This is a new problem. The current episode started 12 to 24 hours ago. The problem occurs 2 to 4 times per day. The problem has been resolved. The emesis has an appearance of stomach contents. There has been no fever. Associated symptoms include diarrhea (5x in last 3 days). Pertinent negatives include no cough and no fever. Risk factors: chemotherapy patient.    Past Medical History:  Diagnosis Date  . Hypertension   . Shortness of breath     Patient Active Problem List   Diagnosis Date Noted  . Cholelithiasis with cholecystitis 11/10/2013  . Pancreatitis, acute 11/10/2013  . URI 12/07/2009  . ALLERGIC RHINITIS 11/22/2009  . GERD 05/20/2009  . CONSTIPATION 05/20/2009  . ABDOMINAL PAIN-RLQ 05/20/2009  . CONTACT DERMATITIS&OTHER ECZEMA DUE UNSPEC CAUSE 01/11/2009  . RECTAL FISSURE 11/05/2008  . VAGINITIS, ATROPHIC 12/26/2007  . DYSHIDROSIS 12/26/2007  . GENERALIZED OSTEOARTHROSIS UNSPECIFIED SITE 12/26/2007  . SINUSITIS- ACUTE-NOS 10/29/2007  . COMMON MIGRAINE 05/18/2007  . HYPERTENSION 05/18/2007  . BACK PAIN, RIGHT 05/15/2007    Past Surgical History:  Procedure Laterality Date  . ABDOMINAL HYSTERECTOMY  1980  . CHOLECYSTECTOMY N/A 11/28/2013   Procedure: LAPAROSCOPIC CHOLECYSTECTOMY WITH INTRAOPERATIVE CHOLANGIOGRAM;  Surgeon: Earnstine Regal, MD;  Location: [facility]S;  Service: General;  Laterality: N/A;    OB History    No data available       Home Medications    Prior to Admission medications   Medication Sig Start Date End Date Taking? Authorizing Provider    calcium-vitamin D (OSCAL) 250-125 MG-UNIT per tablet Take 1 tablet by mouth daily.    Historical Provider, MD  hydrochlorothiazide (HYDRODIURIL) 25 MG tablet Take 25 mg by mouth every morning.    Historical Provider, MD  Iron-Vitamin C (VITRON-C PO) Take 1 tablet by mouth daily.    Historical Provider, MD    Family History No family history on file.  Social History Social History  Substance Use Topics  . Smoking status: Never Smoker  . Smokeless tobacco: Not on file  . Alcohol use No     Allergies   Review of patient's allergies indicates no known allergies.   Review of Systems Review of Systems  Constitutional: Negative for fever.  Respiratory: Negative for cough.   Gastrointestinal: Positive for diarrhea (5x in last 3 days) and vomiting.  All other systems reviewed and are negative.    Physical Exam Updated Vital Signs BP 152/79 (BP Location: Left Arm)   Pulse 77   Temp 99.1 F (37.3 C) (Oral)   Resp 14   SpO2 97%   Physical Exam  Constitutional: She is oriented to person, place, and time. She appears well-developed and well-nourished. No distress.  HENT:  Head: Normocephalic.  Eyes: Conjunctivae are normal.  Neck: Neck supple. No tracheal deviation present.  Cardiovascular: Normal rate, regular rhythm and normal heart sounds.   Pulmonary/Chest: Effort normal and breath sounds normal. No respiratory distress.  Abdominal: Soft. She exhibits no distension. There is no tenderness. There is no rebound and  no guarding.  Hyperactive bowel sounds  Neurological: She is alert and oriented to person, place, and time.  Skin: Skin is warm and dry.  Psychiatric: She has a normal mood and affect.  Vitals reviewed.    ED Treatments / Results  Labs (all labs ordered are listed, but only abnormal results are displayed) Labs Reviewed  COMPREHENSIVE METABOLIC PANEL - Abnormal; Notable for the following:       Result Value   Creatinine, Ser 1.04 (*)    GFR calc non Af  Amer 54 (*)    All other components within normal limits  CBC - Abnormal; Notable for the following:    WBC 3.0 (*)    RBC 3.82 (*)    Hemoglobin 10.9 (*)    HCT 32.4 (*)    All other components within normal limits  URINALYSIS, ROUTINE W REFLEX MICROSCOPIC (NOT AT [facility]) - Abnormal; Notable for the following:    APPearance CLOUDY (*)    Hgb urine dipstick TRACE (*)    Leukocytes, UA TRACE (*)    All other components within normal limits  DIFFERENTIAL - Abnormal; Notable for the following:    Lymphs Abs 0.6 (*)    All other components within normal limits  URINE MICROSCOPIC-ADD ON - Abnormal; Notable for the following:    Squamous Epithelial / LPF 0-5 (*)    Bacteria, UA MANY (*)    All other components within normal limits  URINE CULTURE  LIPASE, BLOOD    EKG  EKG Interpretation None       Radiology No results found.  Procedures Procedures (including critical care time)  Medications Ordered in ED Medications  sodium chloride 0.9 % bolus 1,000 mL (0 mLs Intravenous Stopped 05/24/16 0043)     Initial Impression / Assessment and Plan / ED Course  I have reviewed the triage vital signs and the nursing notes.  Pertinent labs & imaging results that were available during my care of the patient were reviewed by me and considered in my medical decision making (see chart for details).  Clinical Course    70 y.o. female presents with 5 loose stools and nausea. On chemotherapy currently. Not neutropenic, no fevers, no other systemic symptoms other than generalized weakness. Labs reassuring and exam not c/w surgical etiology or indication for imaging. Recommended bismuth salts for slowing progression of illness and supportive care given with IVF. Plan to follow up with PCP as needed and return precautions discussed for worsening or new concerning symptoms.   Final Clinical Impressions(s) / ED Diagnoses   Final diagnoses:  Loose stools  Nausea    New  Prescriptions Discharge Medication List as of 05/24/2016 12:48 AM    START taking these medications   Details  bismuth subsalicylate (PEPTO-BISMOL) 262 MG/15ML suspension Take 30 mLs by mouth every 6 (six) hours as needed., Starting Sun 05/24/2016, Print         Leo Grosser, MD 05/25/16 [phone]

## 2016-05-23 NOTE — ED Notes (Signed)
RN starting IV drawing labs 

## 2016-05-23 NOTE — ED Triage Notes (Signed)
Patient presents for N/V/D x2 days. Reports 2 episodes of emesis, and 5 episodes of watery diarrhea. Reports last chemo 05/19/16. Denies fever.

## 2016-05-24 LAB — DIFFERENTIAL
BASOS ABS: 0 10*3/uL (ref 0.0–0.1)
Basophils Relative: 0 %
EOS PCT: 2 %
Eosinophils Absolute: 0.1 10*3/uL (ref 0.0–0.7)
Lymphocytes Relative: 18 %
Lymphs Abs: 0.6 10*3/uL — ABNORMAL LOW (ref 0.7–4.0)
MONO ABS: 0.4 10*3/uL (ref 0.1–1.0)
Monocytes Relative: 13 %
NEUTROS ABS: 2.1 10*3/uL (ref 1.7–7.7)
NEUTROS PCT: 67 %

## 2016-05-24 LAB — URINALYSIS, ROUTINE W REFLEX MICROSCOPIC
Bilirubin Urine: NEGATIVE
GLUCOSE, UA: NEGATIVE mg/dL
KETONES UR: NEGATIVE mg/dL
NITRITE: NEGATIVE
PROTEIN: NEGATIVE mg/dL
Specific Gravity, Urine: 1.009 (ref 1.005–1.030)
pH: 6.5 (ref 5.0–8.0)

## 2016-05-24 LAB — URINE MICROSCOPIC-ADD ON

## 2016-05-24 MED ORDER — CEPHALEXIN 500 MG PO CAPS: 500.0000 mg | ORAL_CAPSULE | Freq: Two times a day (BID) | ORAL | 0 refills | Status: AC

## 2016-05-24 MED ORDER — BISMUTH SUBSALICYLATE 262 MG/15ML PO SUSP: 30.0000 mL | Freq: Four times a day (QID) | ORAL | 0 refills | Status: AC | PRN

## 2016-05-26 LAB — URINE CULTURE: Culture: >=100000 — AB

## 2016-05-27 ENCOUNTER — Telehealth (HOSPITAL_BASED_OUTPATIENT_CLINIC_OR_DEPARTMENT_OTHER): Payer: Self-pay | Admitting: *Deleted

## 2016-05-27 NOTE — Telephone Encounter (Signed)
Post ED Visit - Positive Culture Follow-up  Culture report reviewed by antimicrobial stewardship pharmacist:  []  Elenor Quinones, Pharm.D. []  Heide Guile, Pharm.D., BCPS []  Parks Neptune, Pharm.D. []  Alycia Rossetti, Pharm.D., BCPS []  [place], [place].D., BCPS, AAHIVP []  Legrand Como, Pharm.D., BCPS, AAHIVP []  Milus Glazier, Pharm.D. []  Rob Evette Doffing, Pharm.Dennard Nip PharmD Positive  Klebsiella pneumoniae urineculture Treated with  cephalexin, organism sensitive to the same and no further patient follow-up is required at this time.  SpraguePhilis Nettle 05/27/2016, 2:24 PM

## 2016-12-10 ENCOUNTER — Other Ambulatory Visit: Payer: Self-pay | Admitting: Family Medicine

## 2016-12-10 DIAGNOSIS — Z1231 Encounter for screening mammogram for malignant neoplasm of breast: Secondary | ICD-10-CM

## 2016-12-30 ENCOUNTER — Ambulatory Visit
Admission: RE | Admit: 2016-12-30 | Discharge: 2016-12-30 | Disposition: A | Discharge status: Home / Self Care | Payer: Medicare Other | Source: Ambulatory Visit | Attending: Family Medicine | Admitting: Family Medicine

## 2016-12-30 DIAGNOSIS — Z1231 Encounter for screening mammogram for malignant neoplasm of breast: Secondary | ICD-10-CM

## 2016-12-31 ENCOUNTER — Other Ambulatory Visit: Payer: Self-pay | Admitting: Family Medicine

## 2016-12-31 DIAGNOSIS — R928 Other abnormal and inconclusive findings on diagnostic imaging of breast: Secondary | ICD-10-CM

## 2017-01-04 ENCOUNTER — Ambulatory Visit
Admission: RE | Admit: 2017-01-04 | Discharge: 2017-01-04 | Disposition: A | Discharge status: Home / Self Care | Payer: Medicare Other | Source: Ambulatory Visit | Attending: Family Medicine | Admitting: Family Medicine

## 2017-01-04 DIAGNOSIS — R928 Other abnormal and inconclusive findings on diagnostic imaging of breast: Secondary | ICD-10-CM

## 2018-02-24 ENCOUNTER — Encounter (HOSPITAL_COMMUNITY): Payer: Self-pay | Admitting: Emergency Medicine

## 2018-02-24 ENCOUNTER — Emergency Department (HOSPITAL_COMMUNITY): Payer: Medicare Other

## 2018-02-24 ENCOUNTER — Other Ambulatory Visit: Payer: Self-pay

## 2018-02-24 ENCOUNTER — Emergency Department (HOSPITAL_COMMUNITY)
Admission: EM | Admit: 2018-02-24 | Discharge: 2018-02-25 | Disposition: A | Discharge status: Home / Self Care | Payer: Medicare Other | Attending: Emergency Medicine | Admitting: Emergency Medicine

## 2018-02-24 DIAGNOSIS — I10 Essential (primary) hypertension: Secondary | ICD-10-CM | POA: Diagnosis not present

## 2018-02-24 DIAGNOSIS — Z7901 Long term (current) use of anticoagulants: Secondary | ICD-10-CM | POA: Diagnosis not present

## 2018-02-24 DIAGNOSIS — C300 Malignant neoplasm of nasal cavity: Secondary | ICD-10-CM | POA: Insufficient documentation

## 2018-02-24 DIAGNOSIS — R531 Weakness: Secondary | ICD-10-CM | POA: Insufficient documentation

## 2018-02-24 DIAGNOSIS — Z79899 Other long term (current) drug therapy: Secondary | ICD-10-CM | POA: Insufficient documentation

## 2018-02-24 DIAGNOSIS — R4182 Altered mental status, unspecified: Secondary | ICD-10-CM | POA: Diagnosis not present

## 2018-02-24 HISTORY — DX: Malignant (primary) neoplasm, unspecified: C80.1

## 2018-02-24 LAB — URINALYSIS, ROUTINE W REFLEX MICROSCOPIC
Bilirubin Urine: NEGATIVE
Glucose, UA: NEGATIVE mg/dL
Hgb urine dipstick: NEGATIVE
Ketones, ur: NEGATIVE mg/dL
Leukocytes, UA: NEGATIVE
Nitrite: NEGATIVE
PROTEIN: NEGATIVE mg/dL
SPECIFIC GRAVITY, URINE: 1.016 (ref 1.005–1.030)
pH: 5 (ref 5.0–8.0)

## 2018-02-24 LAB — CBC WITH DIFFERENTIAL/PLATELET
BASOS PCT: 0 %
Basophils Absolute: 0 10*3/uL (ref 0.0–0.1)
Eosinophils Absolute: 0.1 10*3/uL (ref 0.0–0.7)
Eosinophils Relative: 3 %
HEMATOCRIT: 27.9 % — AB (ref 36.0–46.0)
Hemoglobin: 8.9 g/dL — ABNORMAL LOW (ref 12.0–15.0)
LYMPHS PCT: 16 %
Lymphs Abs: 0.8 10*3/uL (ref 0.7–4.0)
MCH: 26.6 pg (ref 26.0–34.0)
MCHC: 31.9 g/dL (ref 30.0–36.0)
MCV: 83.5 fL (ref 78.0–100.0)
Monocytes Absolute: 0.8 10*3/uL (ref 0.1–1.0)
Monocytes Relative: 15 %
NEUTROS ABS: 3.4 10*3/uL (ref 1.7–7.7)
Neutrophils Relative %: 66 %
PLATELETS: 197 10*3/uL (ref 150–400)
RBC: 3.34 MIL/uL — AB (ref 3.87–5.11)
RDW: 15.8 % — AB (ref 11.5–15.5)
WBC: 5.2 10*3/uL (ref 4.0–10.5)

## 2018-02-24 LAB — COMPREHENSIVE METABOLIC PANEL
ALT: 36 U/L (ref 14–54)
AST: 51 U/L — AB (ref 15–41)
Albumin: 3 g/dL — ABNORMAL LOW (ref 3.5–5.0)
Alkaline Phosphatase: 236 U/L — ABNORMAL HIGH (ref 38–126)
Anion gap: 9 (ref 5–15)
BUN: 28 mg/dL — ABNORMAL HIGH (ref 6–20)
CALCIUM: 9.2 mg/dL (ref 8.9–10.3)
CHLORIDE: 101 mmol/L (ref 101–111)
CO2: 25 mmol/L (ref 22–32)
Creatinine, Ser: 1.2 mg/dL — ABNORMAL HIGH (ref 0.44–1.00)
GFR calc Af Amer: 52 mL/min — ABNORMAL LOW (ref >60)
GFR calc non Af Amer: 45 mL/min — ABNORMAL LOW (ref >60)
Glucose, Bld: 77 mg/dL (ref 65–99)
Potassium: 4.4 mmol/L (ref 3.5–5.1)
SODIUM: 135 mmol/L (ref 135–145)
TOTAL PROTEIN: 6.9 g/dL (ref 6.5–8.1)
Total Bilirubin: 0.8 mg/dL (ref 0.3–1.2)

## 2018-02-24 LAB — TROPONIN I: Troponin I: <0.03 ng/mL (ref <0.03)

## 2018-02-24 MED ORDER — SODIUM CHLORIDE 0.9 % IV BOLUS
500.0000 mL | Freq: Once | INTRAVENOUS | Status: AC
  Administered 2018-02-24: 500 mL via INTRAVENOUS

## 2018-02-24 NOTE — ED Notes (Signed)
ED Provider at bedside. 

## 2018-02-24 NOTE — ED Notes (Signed)
Patient transported to CT 

## 2018-02-24 NOTE — ED Notes (Signed)
Bed: RESA Expected date:  Expected time:  Means of arrival:  Comments: EMS 72 yo female Failure to thrive/cancer patient-care at [facility] MP SR with rate 80 100%RA BP 128/76 CBG 72

## 2018-02-24 NOTE — ED Notes (Signed)
Pt states she has cancer and undergoes treatments at [facility] states last treatment was about 3 weeks ago and is due for one this week  Pt states she is having generalized weakness that started today  Pt has had poor po intake  Pt lives at home with her husband and her daughter

## 2018-02-24 NOTE — ED Provider Notes (Signed)
[facility] DEPT Provider Note   CSN: [id number] Arrival date & time: 02/24/18  1946     History   Chief Complaint Chief Complaint  Patient presents with  . Failure To Thrive  . Cancer patient    HPI Pamela Blake is a 72 y.o. female.  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  Patient reports that she feels very weak today.  She denies any specific complaint.  She denies headache, nausea, vomiting, chest pain, shortness of breath, abdominal pain, or focal weakness.  She denies fever.    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 She reports that she has an appointment tomorrow at [facility] for treatment.   The history is provided by the patient and medical records.  Illness  This is a new problem. The current episode started 6 to 12 hours ago. The problem occurs rarely. The problem has not changed since onset.Pertinent negatives include no chest pain, no abdominal pain, no headaches and no shortness of breath. Nothing aggravates the symptoms. Nothing relieves the symptoms. She has tried nothing for the symptoms.    Past Medical History:  Diagnosis Date  . Cancer ([place])   . Hypertension   . Shortness of breath     Patient Active Problem List   Diagnosis Date Noted  . Cholelithiasis with cholecystitis 11/10/2013  . Pancreatitis, acute 11/10/2013  . URI 12/07/2009  . ALLERGIC RHINITIS 11/22/2009  . GERD 05/20/2009  . CONSTIPATION 05/20/2009  . ABDOMINAL PAIN-RLQ 05/20/2009  . CONTACT DERMATITIS&OTHER ECZEMA DUE UNSPEC CAUSE 01/11/2009  . RECTAL FISSURE 11/05/2008  . VAGINITIS, ATROPHIC 12/26/2007  . DYSHIDROSIS 12/26/2007  . GENERALIZED OSTEOARTHROSIS UNSPECIFIED SITE 12/26/2007  . SINUSITIS- ACUTE-NOS 10/29/2007  . COMMON MIGRAINE 05/18/2007  . HYPERTENSION 05/18/2007  .  BACK PAIN, RIGHT 05/15/2007    Past Surgical History:  Procedure Laterality Date  . ABDOMINAL HYSTERECTOMY  1980  . CHOLECYSTECTOMY N/A 11/28/2013   Procedure: LAPAROSCOPIC CHOLECYSTECTOMY WITH INTRAOPERATIVE CHOLANGIOGRAM;  Surgeon: Earnstine Regal, MD;  Location: [facility] ORS;  Service: General;  Laterality: N/A;     OB History   None      Home Medications    Prior to Admission medications   Medication Sig Start Date End Date Taking? Authorizing Provider  azelastine (OPTIVAR) 0.05 % ophthalmic solution Place 1 drop into both eyes 2 (two) times daily.    [provider]  bismuth subsalicylate (PEPTO-BISMOL) 262 MG/15ML suspension Take 30 mLs by mouth every 6 (six) hours as needed. 05/24/16   Leo Grosser, MD  Calcium Carbonate-Vitamin D (CALCIUM 600+D) 600-400 MG-UNIT tablet Take 1 tablet by mouth daily.    [provider]  cephALEXin (KEFLEX) 500 MG capsule Take 1 capsule (500 mg total) by mouth 2 (two) times daily. 05/24/16   Hedges, Dellis Filbert, PA-C  enoxaparin (LOVENOX) 80 MG/0.8ML injection Inject 80 mg into the skin every 12 (twelve) hours.    [provider]  hydrochlorothiazide (HYDRODIURIL) 25 MG tablet Take 25 mg by mouth daily.     [provider]  loperamide (IMODIUM) 2 MG capsule Take 2 mg by mouth as needed for diarrhea or loose stools.    [provider]  oxyCODONE (OXY IR/ROXICODONE) 5 MG immediate release tablet Take 5 mg by mouth every 4 (four) hours as needed for severe pain.    [provider]  prochlorperazine (COMPAZINE)  10 MG tablet Take 10 mg by mouth every 4 (four) hours as needed for nausea or vomiting.    [provider]    Family History Family History  Problem Relation Age of Onset  . Breast cancer Mother   . Breast cancer Maternal Aunt     Social History Social History   Tobacco Use  . Smoking status: Never Smoker  . Smokeless tobacco: Never Used  Substance Use Topics  . Alcohol use: No  .  Drug use: Not on file     Allergies   Azithromycin   Review of Systems Review of Systems  Respiratory: Negative for shortness of breath.   Cardiovascular: Negative for chest pain.  Gastrointestinal: Negative for abdominal pain.  Neurological: Negative for headaches.  All other systems reviewed and are negative.    Physical Exam Updated Vital Signs BP 130/63 (BP Location: Right Arm)   Pulse 76   Temp 98.3 F (36.8 C) (Oral)   Resp 15   Ht 5\' 9"  (1.753 m)   Wt 55.8 kg (123 lb)   SpO2 99%   BMI 18.16 kg/m   Physical Exam  Constitutional: She is oriented to person, place, and time. She appears well-developed and well-nourished. No distress.  HENT:  Head: Normocephalic and atraumatic.  Mouth/Throat: Oropharynx is clear and moist.  Visible mass in the nasal cavity.  Eyes: Pupils are equal, round, and reactive to light. Conjunctivae and EOM are normal.  Neck: Normal range of motion. Neck supple.  Cardiovascular: Normal rate, regular rhythm and normal heart sounds.  Pulmonary/Chest: Effort normal and breath sounds normal. No respiratory distress.  Abdominal: Soft. She exhibits no distension. There is no tenderness.  Musculoskeletal: Normal range of motion. She exhibits no edema or deformity.  Neurological: She is alert and oriented to person, place, and time.  Skin: Skin is warm and dry.  Psychiatric: She has a normal mood and affect.  Nursing note and vitals reviewed.    ED Treatments / Results  Labs (all labs ordered are listed, but only abnormal results are displayed) Labs Reviewed  COMPREHENSIVE METABOLIC PANEL - Abnormal; Notable for the following components:      Result Value   BUN 28 (*)    Creatinine, Ser 1.20 (*)    Albumin 3.0 (*)    AST 51 (*)    Alkaline Phosphatase 236 (*)    GFR calc non Af Amer 45 (*)    GFR calc Af Amer 52 (*)    All other components within normal limits  CBC WITH DIFFERENTIAL/PLATELET - Abnormal; Notable for the following  components:   RBC 3.34 (*)    Hemoglobin 8.9 (*)    HCT 27.9 (*)    RDW 15.8 (*)    All other components within normal limits  TROPONIN I  URINALYSIS, ROUTINE W REFLEX MICROSCOPIC    EKG EKG Interpretation  Date/Time:  Thursday Feb 24 2018 20:41:32 EDT Ventricular Rate:  82 PR Interval:    QRS Duration: 90 QT Interval:  378 QTC Calculation: 442 R Axis:   33 Text Interpretation:  Sinus rhythm Minimal ST elevation, anterior leads Confirmed by Dene Gentry [phone]) on 02/24/2018 8:46:07 PM   Radiology Ct Head Wo Contrast  Result Date: 02/24/2018 CLINICAL DATA:  72 year old female with increased liver 30 G and altered mental status. EXAM: CT HEAD WITHOUT CONTRAST TECHNIQUE: Contiguous axial images were obtained from the base of the skull through the vertex without intravenous contrast. COMPARISON:  Head CT dated 02/12/2004 and  facial bone CT dated 02/18/2016 FINDINGS: Brain: There is mild age-related atrophy and chronic microvascular ischemic changes. There is no acute intracranial hemorrhage. No mass effect or midline shift. No extra-axial fluid collection. Vascular: No hyperdense vessel or unexpected calcification. Skull: Normal. Negative for fracture or focal lesion. Sinuses/Orbits: There is a partially visualized expansile solid mass measuring approximately 6.0 x 7.5 cm and centered in the nasal cavity. There is destruction of the visualized portion of the medial wall of the left maxillary sinus and inferior aspect of the left lamina Propecia. The mass extends into the left orbit abutting and mildly displacing the left globe. There is remodeling and expansion of the nasal cavity with destruction of ethmoid air cells. There is also displacement, remodeling of the medial wall of the right orbit and right maxillary sinus. There is complete opacification of the paranasal sinuses which may be related to extension of the mass or chronic outflow obstruction. Other: None IMPRESSION: 1. No acute  intracranial hemorrhage. Mild age-related atrophy and chronic microvascular ischemic changes. 2. Solid heterogeneous expansile mass centered in the nasal cavity. There is associated remodeling and/or destruction of the medial walls of the maxillary sinuses and orbits. There is extension of the mass into the left orbit with mild mass effect and displacement of the left globe. This mass has increased in size compared to the CT of 02/18/2016. Further characterization with MRI without and with contrast is recommended. Electronically Signed   By: Anner Crete M.D.   On: 02/24/2018 21:49   Dg Chest Port 1 View  Result Date: 02/24/2018 CLINICAL DATA:  Generalized weakness starting today. Patient has cancer and undergoes treatment at [facility]: PORTABLE CHEST 1 VIEW COMPARISON:  Chest CT 02/27/2004, CXR 11/06/2013 FINDINGS: Right hilar prominence, stable since 2005, felt related to right pulmonary arterial variance based on prior CT. Lungs are clear. Heart is normal in size. There is moderate aortic atherosclerosis without aneurysm. No acute nor suspicious osseous abnormality. IMPRESSION: No active disease. Stable right hilar prominence dating back to 2005. Electronically Signed   By: Ashley Royalty M.D.   On: 02/24/2018 20:57    Procedures Procedures (including critical care time)  Medications Ordered in ED Medications  sodium chloride 0.9 % bolus 500 mL (500 mLs Intravenous New Bag/Given 02/24/18 2100)     Initial Impression / Assessment and Plan / ED Course  I have reviewed the triage vital signs and the nursing notes.  Pertinent labs & imaging results that were available during my care of the patient were reviewed by me and considered in my medical decision making (see chart for details).     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  The labs performed today do not suggest acute  dehydration, acute infection, or other acute abnormality.  CT imaging performed today appears to consistent with recent imaging performed at [facility]. CXR is without acute findings. Patient appears improved following a small bolus of IV fluids.  Case extensively discussed with the patient and the daughters at bedside.  Patient desires to be discharged home. They decline admission for observation.  She has an appointment tomorrow at [facility] for continued treatment of her squamous cell cancer.  The patient and her family understand the need for close follow-up.  Strict return precautions are given and understood.  Final Clinical Impressions(s) / ED Diagnoses   Final diagnoses:  Weakness  ED Discharge Orders    None       Valarie Merino, MD 02/24/18 [phone]

## 2018-02-24 NOTE — ED Triage Notes (Signed)
Patient arrives by [facility] with complaints of failure to thrive/lethargy-coming from family's home where she lives-receives treatment at [facility] increased today. Family called EMS. Poor intake.

## 2018-02-24 NOTE — Discharge Instructions (Signed)
Please return for any problem. Follow up with your regular physician as instructed.  °

## 2018-06-19 DEATH — deceased

## 2018-07-31 IMAGING — CT CT HEAD W/O CM
3 of 4 series · 15 of 47 positions shown, 18 images · non-contrast
Comparison: Head CT dated 02/12/2004 and facial bone CT dated
02/18/2016

CLINICAL DATA: 71-year-old female with increased liver 30 G and
altered mental status.

EXAM:
CT HEAD WITHOUT CONTRAST
TECHNIQUE: Contiguous axial images were obtained from the base of the skull
through the vertex without intravenous contrast.

[Series 4: coronal soft tissue · coronal · 0.32mm/px · 3 of 66 slices shown]
[im 24/66  brain]
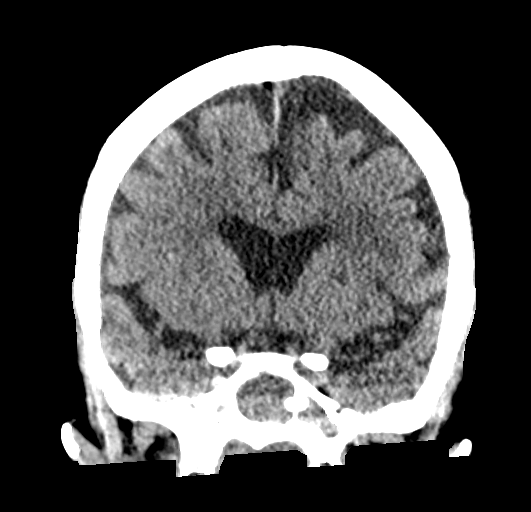
[im 30/66  brain]
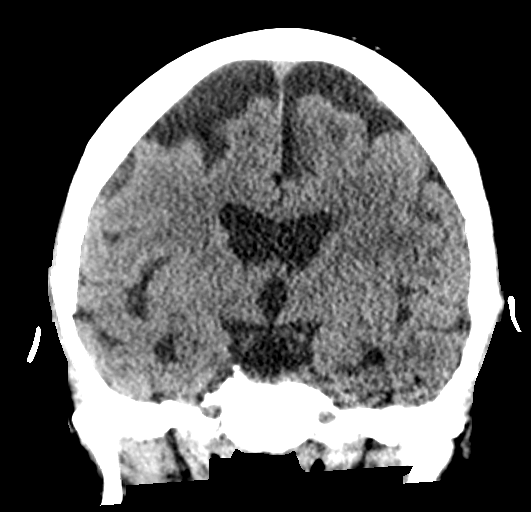
[im 36/66  brain]
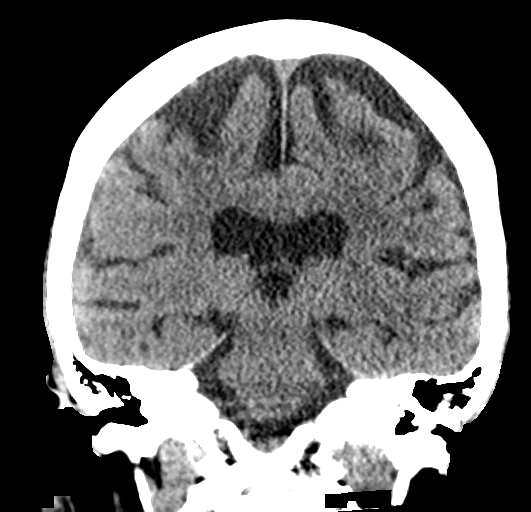

[Series 5: sagittal soft tissue · sagittal · 0.32mm/px · 3 of 57 slices shown]
[im 19/57  brain]
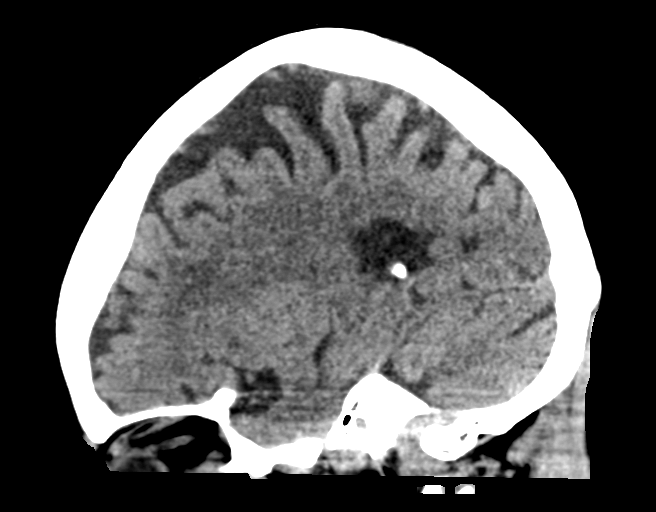
[im 29/57  brain]
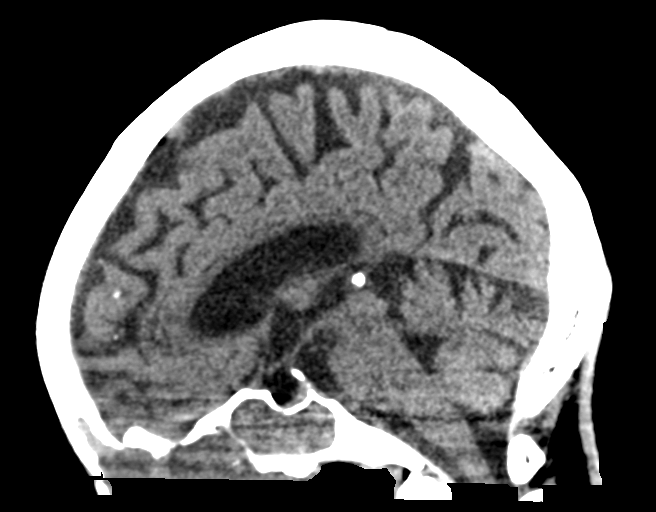
[im 38/57  brain]
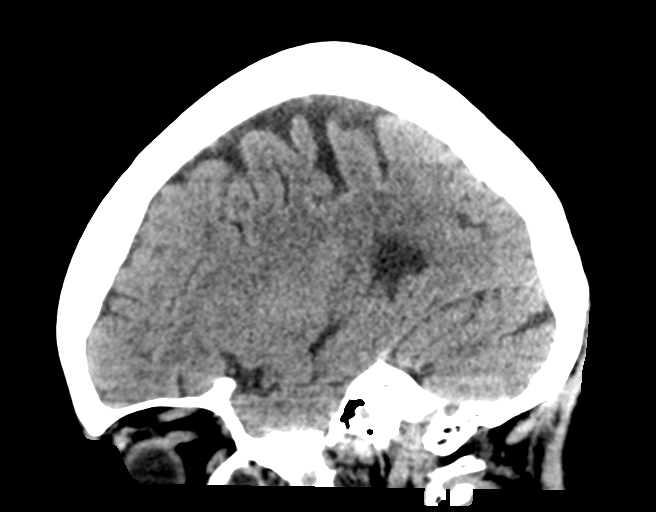

[Series 7: head wo · axial · 0.38mm/px · z∈[-392,-262]mm · 9 of 32 slices shown, 12 images]
[im 3/32  brain]
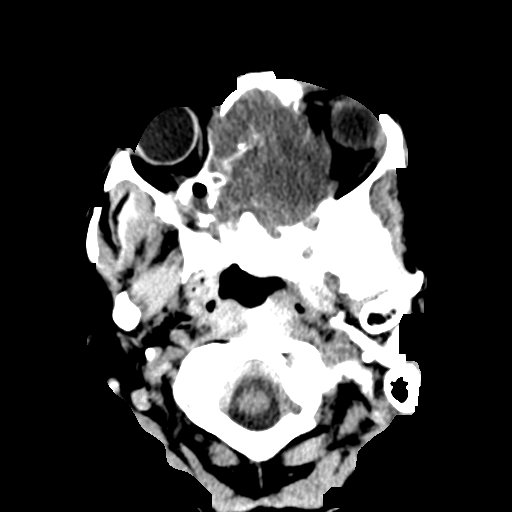
[im 3/32  bone]
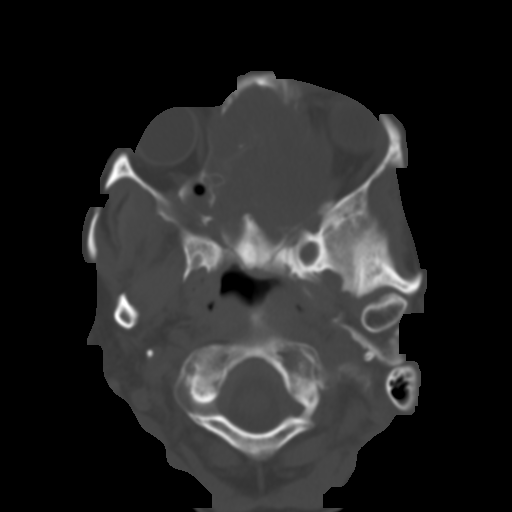
[im 7/32  brain]
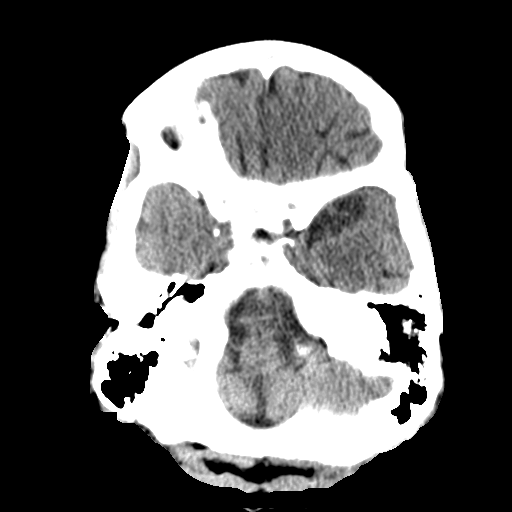
[im 9/32  brain]
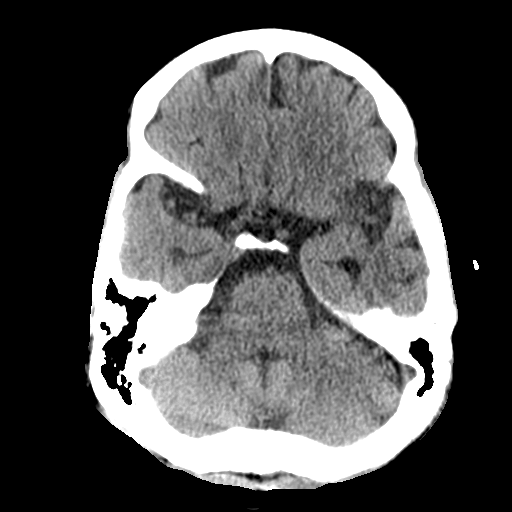
[im 14/32  brain]
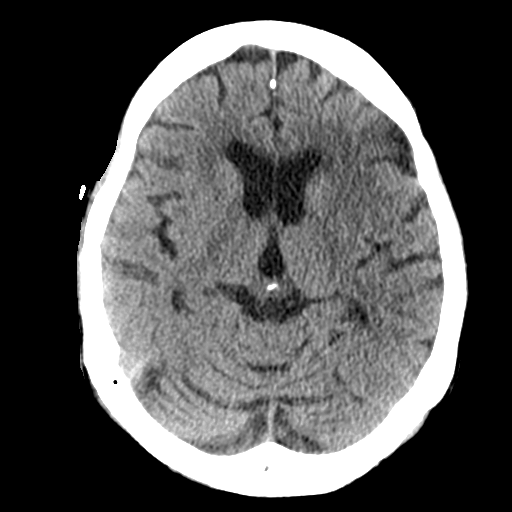
[im 16/32  brain]
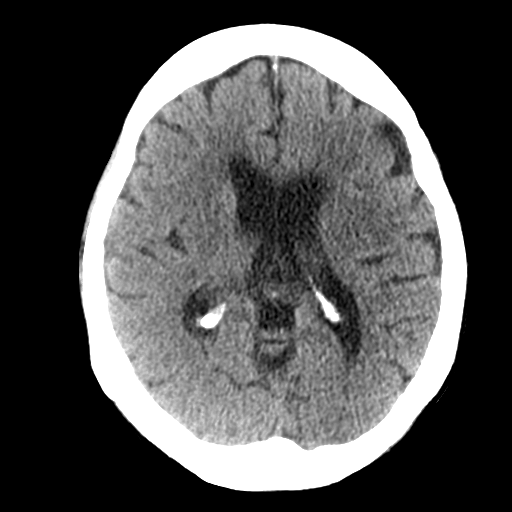
[im 16/32  bone]
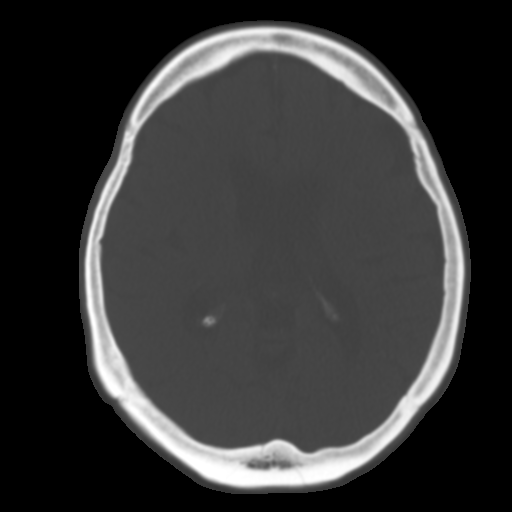
[im 18/32  brain]
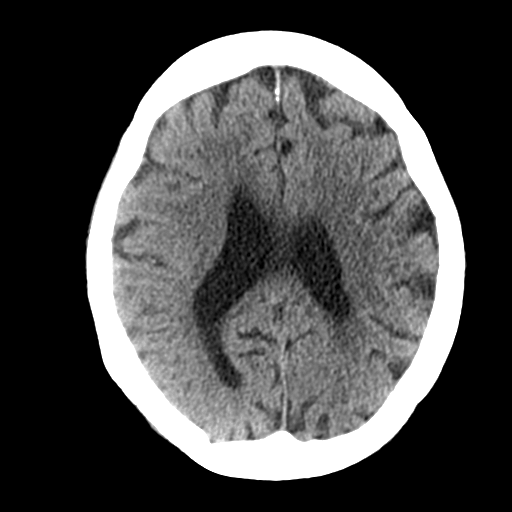
[im 23/32  brain]
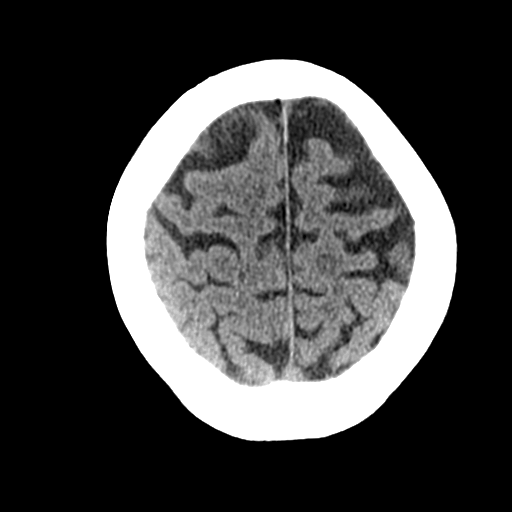
[im 25/32  brain]
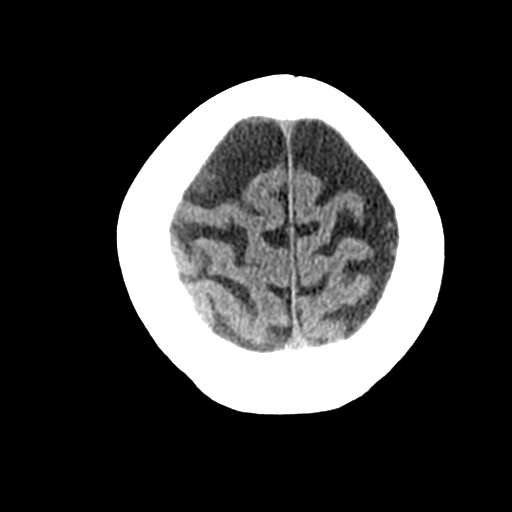
[im 29/32  brain]
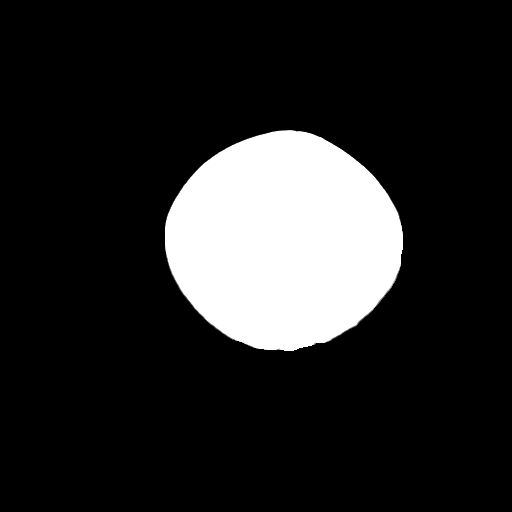
[im 29/32  bone]
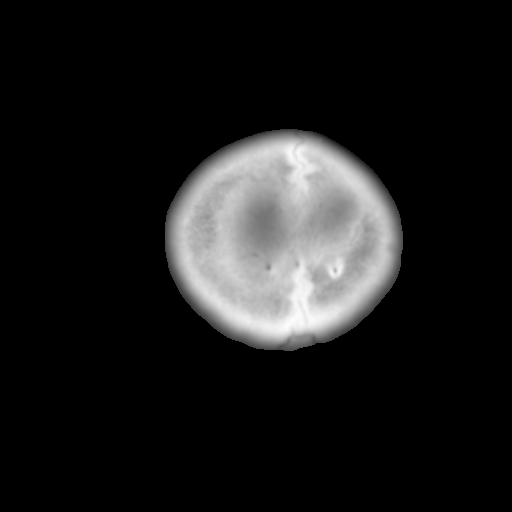

[15 of 47 positions shown; findings below may reference images not displayed]

FINDINGS: Brain: There is mild age-related atrophy and chronic microvascular
ischemic changes. There is no acute intracranial hemorrhage. No mass
effect or midline shift. No extra-axial fluid collection.

Vascular: No hyperdense vessel or unexpected calcification.

Skull: Normal. Negative for fracture or focal lesion.

Sinuses/Orbits: There is a partially visualized expansile solid mass
measuring approximately 6.0 x 7.5 cm and centered in the nasal
cavity. There is destruction of the visualized portion of the medial
wall of the left maxillary sinus and inferior aspect of the left
lamina Propecia. The mass extends into the left orbit abutting and
mildly displacing the left globe. There is remodeling and expansion
of the nasal cavity with destruction of ethmoid air cells. There is
also displacement, remodeling of the medial wall of the right orbit
and right maxillary sinus. There is complete opacification of the
paranasal sinuses which may be related to extension of the mass or
chronic outflow obstruction.

Other: None
IMPRESSION: 1. No acute intracranial hemorrhage. Mild age-related atrophy and
chronic microvascular ischemic changes.
2. Solid heterogeneous expansile mass centered in the nasal cavity.
There is associated remodeling and/or destruction of the medial
walls of the maxillary sinuses and orbits. There is extension of the
mass into the left orbit with mild mass effect and displacement of
the left globe. This mass has increased in size compared to the CT
of 02/18/2016. Further characterization with MRI without and with
contrast is recommended.

## 2019-05-22 ENCOUNTER — Encounter: Payer: Self-pay | Admitting: Internal Medicine
